# Patient Record
Sex: Female | Born: 1955
Health system: Southern US, Community
[De-identification: ages and names within clinical notes are randomized; demographics above are authoritative.]

## PROBLEM LIST (undated history)

## (undated) DIAGNOSIS — Q359 Cleft palate, unspecified: Secondary | ICD-10-CM

## (undated) DIAGNOSIS — Z789 Other specified health status: Secondary | ICD-10-CM

## (undated) DIAGNOSIS — K8020 Calculus of gallbladder without cholecystitis without obstruction: Secondary | ICD-10-CM

## (undated) DIAGNOSIS — M79609 Pain in unspecified limb: Secondary | ICD-10-CM

## (undated) DIAGNOSIS — B019 Varicella without complication: Secondary | ICD-10-CM

## (undated) DIAGNOSIS — E782 Mixed hyperlipidemia: Secondary | ICD-10-CM

## (undated) DIAGNOSIS — E559 Vitamin D deficiency, unspecified: Secondary | ICD-10-CM

## (undated) DIAGNOSIS — B059 Measles without complication: Secondary | ICD-10-CM

## (undated) DIAGNOSIS — I10 Essential (primary) hypertension: Secondary | ICD-10-CM

## (undated) DIAGNOSIS — H43811 Vitreous degeneration, right eye: Secondary | ICD-10-CM

## (undated) DIAGNOSIS — K219 Gastro-esophageal reflux disease without esophagitis: Secondary | ICD-10-CM

## (undated) DIAGNOSIS — M109 Gout, unspecified: Secondary | ICD-10-CM

## (undated) DIAGNOSIS — N951 Menopausal and female climacteric states: Secondary | ICD-10-CM

## (undated) DIAGNOSIS — E039 Hypothyroidism, unspecified: Secondary | ICD-10-CM

## (undated) DIAGNOSIS — I471 Supraventricular tachycardia, unspecified: Secondary | ICD-10-CM

## (undated) DIAGNOSIS — J45909 Unspecified asthma, uncomplicated: Secondary | ICD-10-CM

## (undated) DIAGNOSIS — H8110 Benign paroxysmal vertigo, unspecified ear: Secondary | ICD-10-CM

## (undated) DIAGNOSIS — M1990 Unspecified osteoarthritis, unspecified site: Secondary | ICD-10-CM

## (undated) DIAGNOSIS — Z8679 Personal history of other diseases of the circulatory system: Secondary | ICD-10-CM

## (undated) DIAGNOSIS — F419 Anxiety disorder, unspecified: Secondary | ICD-10-CM

## (undated) DIAGNOSIS — L851 Acquired keratosis [keratoderma] palmaris et plantaris: Secondary | ICD-10-CM

## (undated) DIAGNOSIS — T7840XA Allergy, unspecified, initial encounter: Secondary | ICD-10-CM

## (undated) HISTORY — DX: Measles without complication: B05.9

## (undated) HISTORY — DX: Supraventricular tachycardia: I47.1

## (undated) HISTORY — DX: Cleft palate, unspecified: Q35.9

## (undated) HISTORY — PX: COSMETIC SURGERY: SHX468

## (undated) HISTORY — DX: Gastro-esophageal reflux disease without esophagitis: K21.9

## (undated) HISTORY — DX: Other specified health status: Z78.9

## (undated) HISTORY — PX: OOPHORECTOMY: SHX86

## (undated) HISTORY — DX: Hypothyroidism, unspecified: E03.9

## (undated) HISTORY — DX: Vitamin D deficiency, unspecified: E55.9

## (undated) HISTORY — PX: OTHER SURGICAL HISTORY: SHX169

## (undated) HISTORY — DX: Allergy, unspecified, initial encounter: T78.40XA

## (undated) HISTORY — DX: Unspecified osteoarthritis, unspecified site: M19.90

## (undated) HISTORY — DX: Acquired keratosis (keratoderma) palmaris et plantaris: L85.1

## (undated) HISTORY — DX: Unspecified asthma, uncomplicated: J45.909

## (undated) HISTORY — PX: TUBAL LIGATION: SHX77

## (undated) HISTORY — DX: Anxiety disorder, unspecified: F41.9

## (undated) HISTORY — DX: Calculus of gallbladder without cholecystitis without obstruction: K80.20

## (undated) HISTORY — DX: Menopausal and female climacteric states: N95.1

## (undated) HISTORY — DX: Supraventricular tachycardia, unspecified: I47.10

## (undated) HISTORY — DX: Varicella without complication: B01.9

## (undated) HISTORY — DX: Pain in unspecified limb: M79.609

## (undated) HISTORY — DX: Benign paroxysmal vertigo, unspecified ear: H81.10

## (undated) HISTORY — DX: Gout, unspecified: M10.9

## (undated) HISTORY — DX: Vitreous degeneration, right eye: H43.811

## (undated) HISTORY — DX: Essential (primary) hypertension: I10

## (undated) HISTORY — DX: Personal history of other diseases of the circulatory system: Z86.79

## (undated) HISTORY — DX: Mixed hyperlipidemia: E78.2

---

## 1994-10-21 HISTORY — PX: OTHER SURGICAL HISTORY: SHX169

## 1996-10-21 HISTORY — PX: GALLBLADDER SURGERY: SHX652

## 1998-03-17 ENCOUNTER — Other Ambulatory Visit: Admission: RE | Admit: 1998-03-17 | Discharge: 1998-03-17 | Payer: Self-pay

## 1998-05-15 ENCOUNTER — Inpatient Hospital Stay (HOSPITAL_COMMUNITY): Admission: AD | Admit: 1998-05-15 | Discharge: 1998-05-17 | Payer: Self-pay | Admitting: *Deleted

## 1998-07-13 ENCOUNTER — Observation Stay (HOSPITAL_COMMUNITY): Admission: RE | Admit: 1998-07-13 | Discharge: 1998-07-14 | Payer: Self-pay | Admitting: Internal Medicine

## 1999-02-12 ENCOUNTER — Inpatient Hospital Stay (HOSPITAL_COMMUNITY): Admission: RE | Admit: 1999-02-12 | Discharge: 1999-02-13 | Payer: Self-pay | Admitting: Obstetrics & Gynecology

## 1999-10-22 HISTORY — PX: ABDOMINAL HYSTERECTOMY: SUR658

## 2001-03-11 ENCOUNTER — Other Ambulatory Visit: Admission: RE | Admit: 2001-03-11 | Discharge: 2001-03-11 | Payer: Self-pay | Admitting: Obstetrics & Gynecology

## 2002-07-27 ENCOUNTER — Other Ambulatory Visit: Admission: RE | Admit: 2002-07-27 | Discharge: 2002-07-27 | Payer: Self-pay | Admitting: Family Medicine

## 2004-10-26 ENCOUNTER — Ambulatory Visit: Payer: Self-pay | Admitting: Internal Medicine

## 2005-05-15 ENCOUNTER — Ambulatory Visit: Payer: Self-pay | Admitting: Internal Medicine

## 2006-07-11 ENCOUNTER — Other Ambulatory Visit: Admission: RE | Admit: 2006-07-11 | Discharge: 2006-07-11 | Payer: Self-pay | Admitting: Family Medicine

## 2007-10-22 HISTORY — PX: FOOT SURGERY: SHX648

## 2008-06-15 ENCOUNTER — Encounter (INDEPENDENT_AMBULATORY_CARE_PROVIDER_SITE_OTHER): Payer: Self-pay | Admitting: Orthopedic Surgery

## 2008-06-15 ENCOUNTER — Ambulatory Visit (HOSPITAL_BASED_OUTPATIENT_CLINIC_OR_DEPARTMENT_OTHER): Admission: RE | Admit: 2008-06-15 | Discharge: 2008-06-15 | Payer: Self-pay | Admitting: Orthopedic Surgery

## 2009-09-21 ENCOUNTER — Encounter: Admission: RE | Admit: 2009-09-21 | Discharge: 2009-09-21 | Payer: Self-pay | Admitting: Family Medicine

## 2010-08-01 ENCOUNTER — Ambulatory Visit: Payer: Self-pay | Admitting: Family Medicine

## 2010-12-05 LAB — HM PAP SMEAR: HM Pap smear: NORMAL

## 2011-01-17 ENCOUNTER — Ambulatory Visit: Payer: Self-pay | Admitting: Family Medicine

## 2011-03-04 ENCOUNTER — Other Ambulatory Visit: Payer: Self-pay | Admitting: Dermatology

## 2011-03-05 NOTE — Op Note (Signed)
Stacy Hart, Stacy Hart               ACCOUNT NO.:  [id number]   MEDICAL RECORD NO.:  [id number]          PATIENT TYPE:  AMB   LOCATION:  DSC                          FACILITY:  [facility]   PHYSICIAN:  Leonides Grills, M.D.     DATE OF BIRTH:  [birth year]   DATE OF PROCEDURE:  DATE OF DISCHARGE:                               OPERATIVE REPORT   PREOPERATIVE DIAGNOSIS:  Left third webspace Morton's neuroma.   POSTOPERATIVE DIAGNOSIS:  Left third webspace Morton's neuroma.   OPERATION:  Excision left third webspace Morton's neuroma.   ANESTHESIA:  General.   SURGEON:  Leonides Grills, MD   ASSISTANT:  Richardean Canal, PA-C   ESTIMATED BLOOD LOSS:  Minimal.   TOURNIQUET TIME:  Approximately 20 minutes.   COMPLICATIONS:  None.   SPECIMEN:  Neuroma sent to pathology.   DISPOSITION:  Stable to PR.   INDICATIONS:  This is a 55 year old female who has had longstanding left  forefoot pain that was interfering with her life with a Mulder click and  tenderness in the plantar aspect of third webspace.  She consented for  the above procedure.  All risks of infection, nerve or vessel injury,  recurrence of neuroma, persistent pain, worsening pain, prolonged  recovery, hematoma formation, sinus formation were all explained.  Questions were encouraged and answered.   OPERATION:  The patient was brought to the operating room, placed in  supine position after adequate general anesthesia was administered as  well as Ancef 1 g IV piggyback.  The left lower extremity was then  prepped and draped in sterile manner over proximally thigh tourniquet.  Limb was gravity exsanguinated.  Tourniquet was elevated to 290 mmHg.  A  longitudinal incision on the dorsal aspect of the left third webspace  was then made.  Dissection was carried down through the skin.  Hemostasis was obtained.  A Morton's neuroma spreader was then placed.  The transverse metatarsal ligament was incised.  The neuroma was  obviously seen and  this was quite large.  This was then delivered  through the wound through the plantar pressure and then the distal  digital branches were cut.  The neuroma was carefully dissected back  about 2 cm proximal to metatarsal head and then cut in the muscular  part of the foot.  This was then sent to pathology.  Tourniquet was  deflated.  Hemostasis was obtained.  There was no pulsatile bleeding.  The area was copiously irrigated with normal saline.  Subcu was closed  with 3-0 Vicryl.  Skin was closed with 4-0 nylon.  Sterile dressing was  applied.  Hard sole shoe was applied.  The patient was stable to PR.      Leonides Grills, M.D.  Electronically Signed     PB/MEDQ  D:  06/15/2008  T:  06/15/2008  Job:  [id number]

## 2012-02-19 IMAGING — CR DG FOOT COMPLETE 3+V*L*
1 series · 3 of 3 positions shown · non-contrast
Comparison: none

REASON FOR EXAM: left foot pain
COMMENTS:

PROCEDURE:     KDR - KDXR FOOT LT COMP W/OBLIQUES  - August 01, 2010 [DATE]
RESULT:     No acute bony or joint abnormality identified. There is no
evidence of fracture.

[Series 2: view not recorded · 0.17mm/px · 3 of 3 slices shown]
[im 1/3]
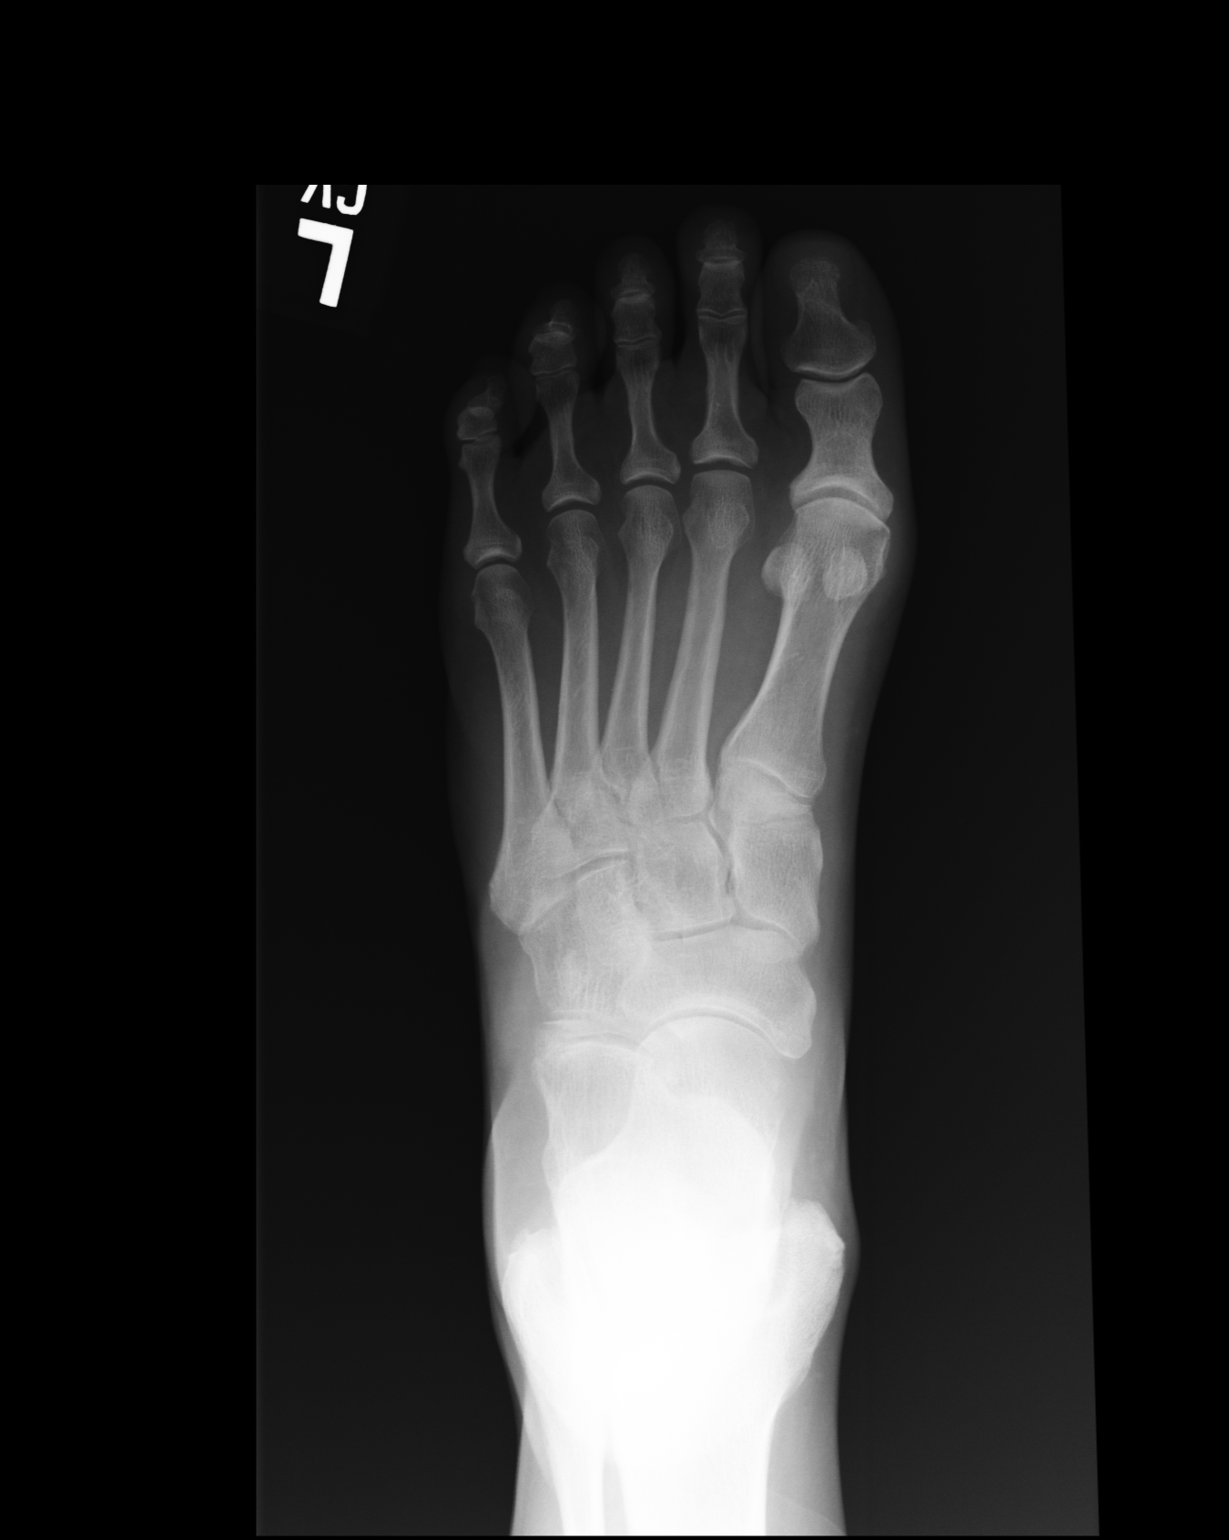
[im 2/3]
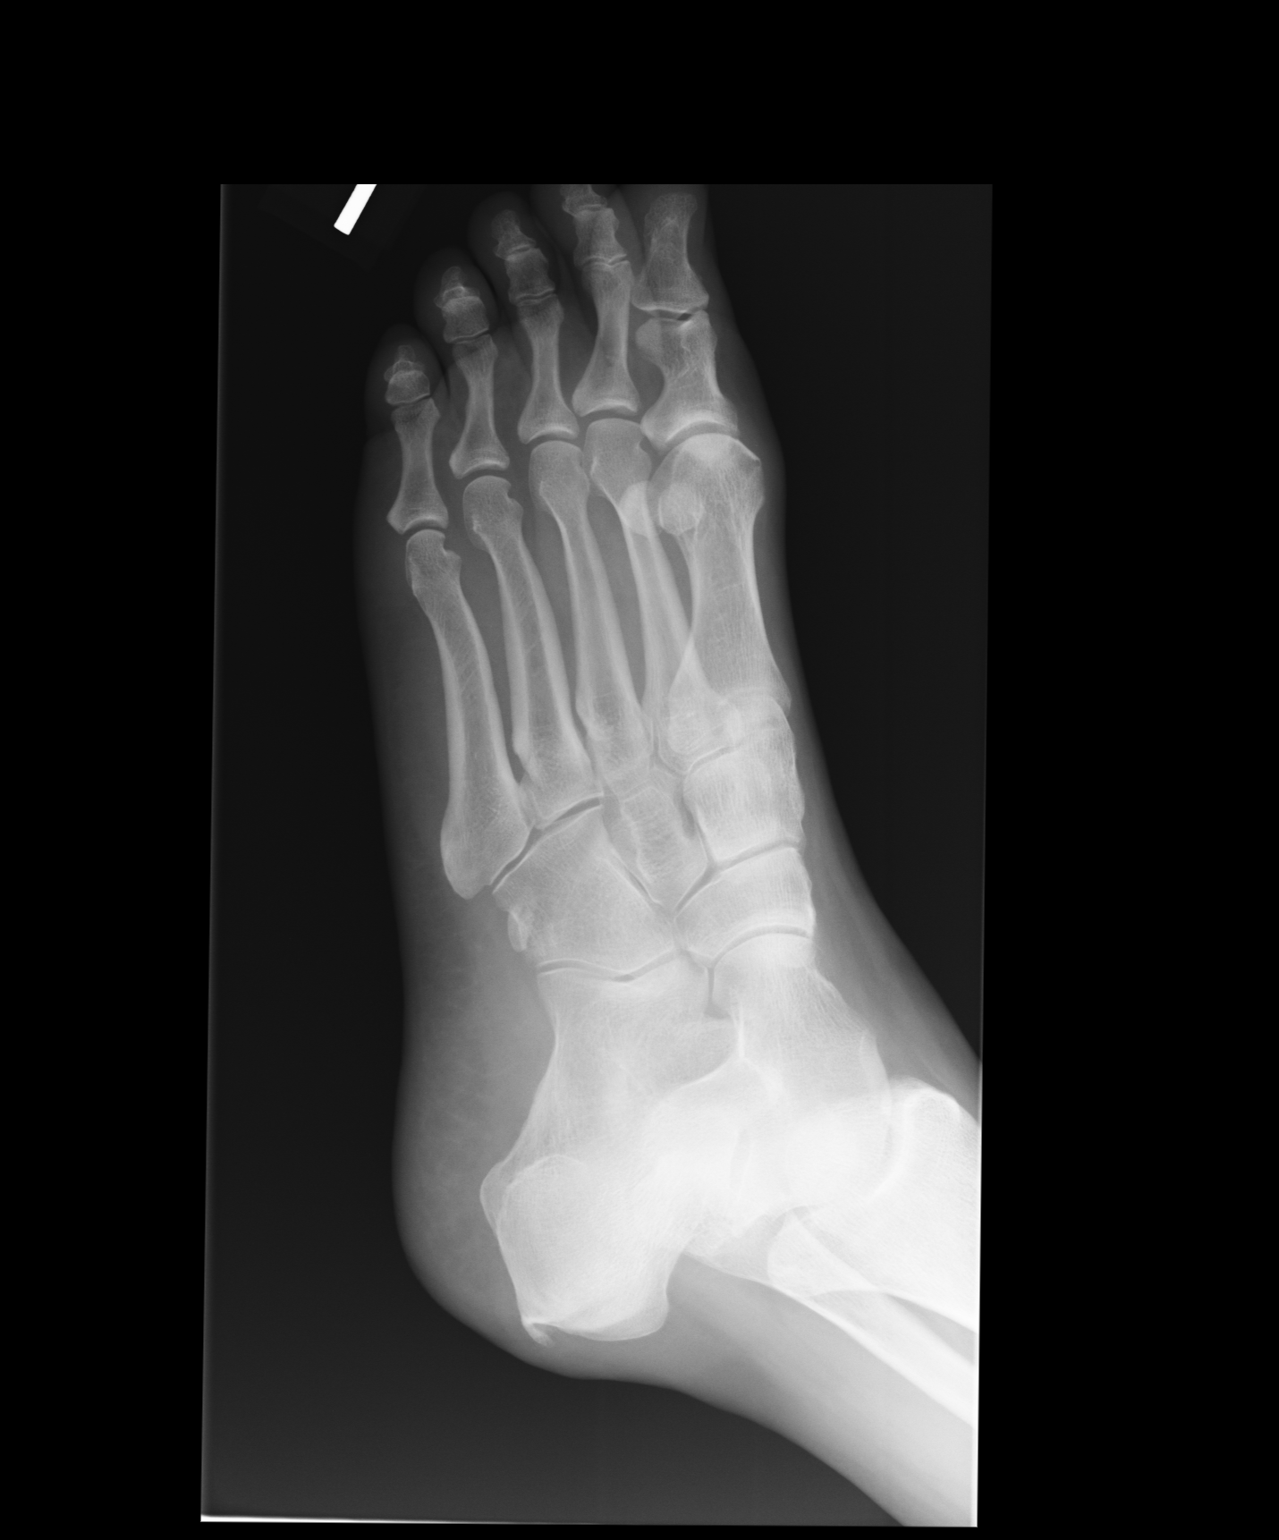
[im 3/3]
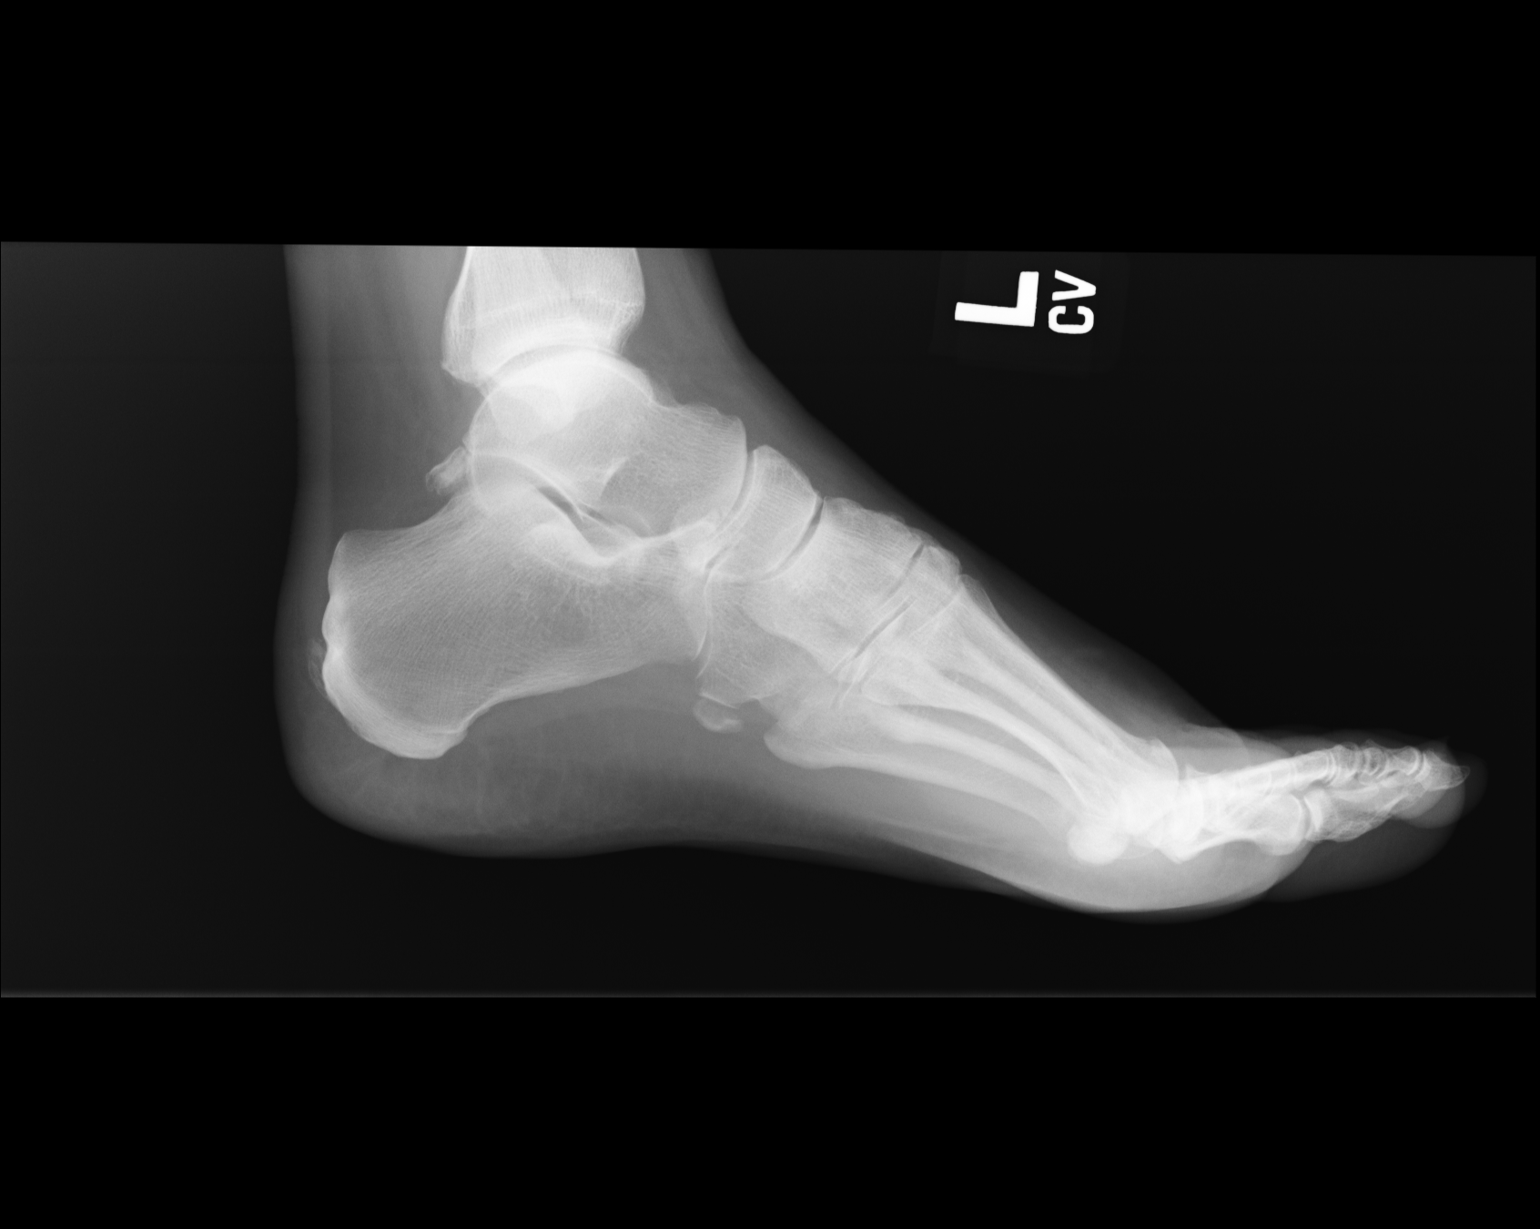

[3 of 3 positions shown; findings below may reference images not displayed]

IMPRESSION: No acute abnormality.

## 2012-06-01 ENCOUNTER — Telehealth: Payer: Self-pay

## 2012-06-01 NOTE — Telephone Encounter (Signed)
Just realized you are here today, can you advise on patient medication renewal? Estradiol 0.5

## 2012-06-01 NOTE — Telephone Encounter (Signed)
I need to speak to Maralyn Sago about this and then will advise, I think she will need to get this from Dr Katrinka Blazing until she is established here.

## 2012-06-01 NOTE — Telephone Encounter (Signed)
PT HAS AN APPT W/ DR Katrinka Blazing IN December BUT NEEDS A REFILL OF HER ESTRADIOL 0.5 MG [facility] [place] #- [phone] CELL# [id number]

## 2012-06-02 NOTE — Telephone Encounter (Signed)
Please have patient contact [facility] for refills until she establishes here in 09/2012.  Thanks, KMS

## 2012-06-03 NOTE — Telephone Encounter (Signed)
I called patient back, she is advised the office in [place] is refilling her patients medications until her patients establish here.

## 2012-08-17 ENCOUNTER — Telehealth: Payer: Self-pay

## 2012-08-17 NOTE — Telephone Encounter (Signed)
This is a former patient from [facility], who states in September, she requested her records to be sent here. She is scheduled to see Dr Katrinka Blazing 10/19/12, but states she needs a refill on Estradiol .05mg . She will be out in 1 week. If rx can be filled, pt [facility] [place] @ [phone]

## 2012-08-18 NOTE — Telephone Encounter (Signed)
Called pt and explained that Stacy Hart should RF her Rxs until she can be seen here in Dec. Pt agreed to call them and will call me back if they are unwilling to RF it until her appt here.  Pt asked that we check to see if Stacy Hart has sent her records and also her daughter's Stacy Hart, DOB 12/25/90. Med Recs can you please check to see if we have received these yet and either let pt know or route back so that we can call her?

## 2012-08-19 NOTE — Telephone Encounter (Signed)
I have scanned in her daughters records put I have not received her records yet.  Dr Katrinka Blazing may have these records because she has a lot of her patients records with her.

## 2012-08-19 NOTE — Telephone Encounter (Signed)
Dr Katrinka Blazing, do you know if you have this pt's records from [facility]?

## 2012-08-28 NOTE — Telephone Encounter (Signed)
Please call pt back; I have received records from [facility].  KMS

## 2012-08-30 NOTE — Telephone Encounter (Signed)
Pt notified that records were recieved

## 2012-09-03 ENCOUNTER — Encounter: Payer: Self-pay | Admitting: *Deleted

## 2012-09-14 ENCOUNTER — Ambulatory Visit (INDEPENDENT_AMBULATORY_CARE_PROVIDER_SITE_OTHER): Payer: BC Managed Care – PPO | Admitting: Family Medicine

## 2012-09-14 ENCOUNTER — Encounter: Payer: Self-pay | Admitting: Family Medicine

## 2012-09-14 VITALS — BP 126/90 | HR 79 | Temp 98.6°F | Resp 16 | Ht 64.0 in | Wt 151.4 lb

## 2012-09-14 DIAGNOSIS — E559 Vitamin D deficiency, unspecified: Secondary | ICD-10-CM

## 2012-09-14 DIAGNOSIS — M109 Gout, unspecified: Secondary | ICD-10-CM

## 2012-09-14 DIAGNOSIS — E039 Hypothyroidism, unspecified: Secondary | ICD-10-CM

## 2012-09-14 DIAGNOSIS — I10 Essential (primary) hypertension: Secondary | ICD-10-CM

## 2012-09-14 DIAGNOSIS — E782 Mixed hyperlipidemia: Secondary | ICD-10-CM

## 2012-09-14 LAB — T4, FREE: Free T4: 1.21 ng/dL (ref 0.80–1.80)

## 2012-09-14 LAB — COMPREHENSIVE METABOLIC PANEL
ALT: 22 U/L (ref 0–35)
AST: 20 U/L (ref 0–37)
Alkaline Phosphatase: 47 U/L (ref 39–117)
Creat: 0.54 mg/dL (ref 0.50–1.10)
Total Bilirubin: 0.6 mg/dL (ref 0.3–1.2)

## 2012-09-14 LAB — CBC WITH DIFFERENTIAL/PLATELET
Basophils Absolute: 0.1 10*3/uL (ref 0.0–0.1)
Basophils Relative: 1 % (ref 0–1)
Eosinophils Absolute: 0.3 10*3/uL (ref 0.0–0.7)
Eosinophils Relative: 3 % (ref 0–5)
Hemoglobin: 15.4 g/dL — ABNORMAL HIGH (ref 12.0–15.0)
MCH: 31 pg (ref 26.0–34.0)
MCV: 89.3 fL (ref 78.0–100.0)
Platelets: 218 10*3/uL (ref 150–400)
RBC: 4.97 MIL/uL (ref 3.87–5.11)
RDW: 13 % (ref 11.5–15.5)
WBC: 10.4 10*3/uL (ref 4.0–10.5)

## 2012-09-14 LAB — LIPID PANEL
Cholesterol: 165 mg/dL (ref 0–200)
Total CHOL/HDL Ratio: 5.2 Ratio

## 2012-09-14 LAB — URIC ACID: Uric Acid, Serum: 7.8 mg/dL — ABNORMAL HIGH (ref 2.4–7.0)

## 2012-09-14 LAB — TSH: TSH: 3.849 u[IU]/mL (ref 0.350–4.500)

## 2012-09-14 MED ORDER — AMOXICILLIN 500 MG PO CAPS: 1000.0000 mg | ORAL_CAPSULE | Freq: Two times a day (BID) | ORAL | Status: DC

## 2012-09-14 MED ORDER — CHOLESTYRAMINE 4 GM/DOSE PO POWD: 4.0000 g | Freq: Two times a day (BID) | ORAL | Status: DC

## 2012-09-14 MED ORDER — LEVOTHYROXINE SODIUM 100 MCG PO TABS: ORAL_TABLET | ORAL | Status: DC

## 2012-09-14 MED ORDER — TRIAMTERENE-HCTZ 37.5-25 MG PO TABS: 1.0000 | ORAL_TABLET | Freq: Every day | ORAL | Status: DC

## 2012-09-14 NOTE — Progress Notes (Signed)
[address]   [place], [place]  [address]   [phone]  Subjective:    Patient ID: Stacy Hart, female    DOB: [birth year], 56 y.o.   MRN: [id number]  HPIThis 56 y.o. female presents for evaluation of the following:  1.  Family history of blood clots: brother with pulmonary embolism, DVT. History of varicose veins.  Started on Xarelto.  Abnormal EKG.  Age 76.  Retired.  CVA retinal; presented with vision blurred and loss.  Seeing a specialist; no regaining of vision at this time.  Also DMII.  2.  HTN:  Six month follow-up; no changes to management at last visit; not checking blood pressure at home. Reports compliance with medication; good tolerance to medication; good symptom control.    3.  Sinus pressure: pain in frontal regions.  +nasal congestion.  L sided congestion.  Tight in eyes.  Using Lubricant eye drops. Ears have been hurting.  Intolerant to antbiotics.  +dizizness.  Scarring in ear.  Duration three months ago.  Took Advil without improvement.  Taking Fluticasone but caused SVT.  Unable to take Allegra.  Has taken children's Benadryl.  Teeth hurt.    4. Hypothyroidism: stable; compliance with medication.  Tolerating recent switch in doses.    5. Hyperlipidemia: stable; cannot tolerate many medications; has lost some weight since last visit intentionally.  6. Gout: rare attacks since last visit; moderate compliance with medication; good tolerance to medication; good symptom control.   7. Vitamin D deficiency: tolerating daily supplementation without side effects; due for repeat labs.     Review of Systems  Constitutional: Negative for fever, chills, diaphoresis and fatigue.  HENT: Positive for congestion, rhinorrhea, mouth sores, voice change, postnasal drip and sinus pressure. Negative for ear pain, sore throat and trouble swallowing.   Respiratory: Negative for shortness of breath, wheezing and stridor.   Cardiovascular: Positive for palpitations. Negative for  chest pain and leg swelling.  Gastrointestinal: Positive for abdominal pain. Negative for nausea, vomiting and diarrhea.  Musculoskeletal: Negative for joint swelling and arthralgias.  Neurological: Positive for headaches. Negative for dizziness, tremors, seizures, syncope, facial asymmetry, speech difficulty, weakness, light-headedness and numbness.    Past Medical History  Diagnosis Date  . Symptomatic menopausal or female climacteric states   . Unspecified vitamin D deficiency   . Unspecified essential hypertension   . Mixed hyperlipidemia   . Benign paroxysmal positional vertigo   . Acquired keratoderma   . Pain in limb   . Esophageal reflux   . Unspecified asthma   . Gout, unspecified   . Unspecified hypothyroidism   . Personal history of other diseases of circulatory system   . Other specified conditions influencing health status   . Cleft palate   . Chicken pox     childhood  . Measles     childhood    Past Surgical History  Procedure Laterality Date  . Surgeries for cleft palatte and lips      [facility]  . Heart ablation  1996    SVT  . Gallbladder surgery  1998  . Abdominal hysterectomy  2001    Ovaries intact, DUB, uterine fibroids  . Foot surgery  2009    Left   Morton's Neuroma    Prior to Admission medications   Medication Sig Start Date End Date Taking? Authorizing Provider  allopurinol (ZYLOPRIM) 100 MG tablet Take 100 mg by mouth 2 (two) times daily.   Yes Historical Provider, MD  ALPRAZolam Prudy Feeler) 0.25  MG tablet Take 0.25 mg by mouth at bedtime as needed.   Yes Historical Provider, MD  cholestyramine Lanetta Inch) 4 GM/DOSE powder Take 1 packet (4 g total) by mouth 2 (two) times daily with a meal. 09/14/12  Yes Ethelda Chick, MD  estradiol (ESTRACE) 0.5 MG tablet Take 0.5 mg by mouth daily.   Yes Historical Provider, MD  levothyroxine (SYNTHROID, LEVOTHROID) 100 MCG tablet One tablet five days per week 09/14/12  Yes Ethelda Chick, MD  levothyroxine  (SYNTHROID, LEVOTHROID) 88 MCG tablet Take 88 mcg by mouth 4 (four) times a week.   Yes Historical Provider, MD  metoprolol (LOPRESSOR) 50 MG tablet Take 50 mg by mouth daily.   Yes Historical Provider, MD  olopatadine (PATANOL) 0.1 % ophthalmic solution 1 drop 2 (two) times daily.   Yes Historical Provider, MD  triamterene-hydrochlorothiazide (MAXZIDE-25) 37.5-25 MG per tablet Take 1 each (1 tablet total) by mouth daily. 09/14/12  Yes Ethelda Chick, MD  amoxicillin (AMOXIL) 500 MG capsule Take 2 capsules (1,000 mg total) by mouth 2 (two) times daily. 09/14/12   Ethelda Chick, MD  fluticasone (FLONASE) 50 MCG/ACT nasal spray Place 2 sprays into the nose daily.    Historical Provider, MD    Allergies  Allergen Reactions  . Epinephrine     "interferes with heart"  . Sulfa Antibiotics   . Ceftin (Cefuroxime Axetil) Rash    History   Social History  . Marital Status: Married    Spouse Name: N/A    Number of Children: 2  . Years of Education: 12   Occupational History  . Full time Parent    Social History Main Topics  . Smoking status: Former Smoker -- 2.00 packs/day for 7 years    Types: Cigarettes  . Smokeless tobacco: Not on file     Comment: quit 28 yrs ago  . Alcohol Use: Yes     Comment: occasonal once a week  . Drug Use: No  . Sexually Active: Not on file   Other Topics Concern  . Not on file   Social History Narrative   Married x 27 years,happily, no domestic abuse. No guns in the home. Smoke alarm in the home. Caffeine use: None. Well balanced diet. Exercise history: Light, walking , 20 min., 2 - 3 times weekly. Organ donor: NO. Pt DOES have Living Will. DOES have HCPOA.    Family History  Problem Relation Age of Onset  . Heart disease Mother   . Stroke Mother   . Depression Mother   . Diabetes Mother   . Cancer Mother   . Heart disease Father   . Diabetes Sister   . Heart disease Sister   . Diabetes Brother   . Pulmonary embolism Brother   . Deep vein  thrombosis Brother   . Hypertension Sister   . Anxiety disorder Sister        Objective:   Physical Exam  Nursing note and vitals reviewed. Constitutional: She is oriented to person, place, and time. She appears well-developed and well-nourished. No distress.  HENT:  Head: Normocephalic and atraumatic.  Right Ear: External ear normal.  Left Ear: External ear normal.  Nose: Mucosal edema and rhinorrhea present. Right sinus exhibits maxillary sinus tenderness and frontal sinus tenderness. Left sinus exhibits maxillary sinus tenderness and frontal sinus tenderness.  Mouth/Throat: Oropharynx is clear and moist.  Eyes: Conjunctivae and EOM are normal. Pupils are equal, round, and reactive to light.  Neck: Normal range of  motion. Neck supple. No JVD present. No thyromegaly present.  Cardiovascular: Normal rate, regular rhythm, normal heart sounds and intact distal pulses.  Exam reveals no gallop and no friction rub.   No murmur heard. Pulmonary/Chest: Effort normal and breath sounds normal. She has no wheezes. She has no rales.  Lymphadenopathy:    She has no cervical adenopathy.  Neurological: She is alert and oriented to person, place, and time. No cranial nerve deficit. She exhibits normal muscle tone. Coordination normal.  Skin: Skin is warm and dry. No rash noted. She is not diaphoretic.  Psychiatric: She has a normal mood and affect. Her behavior is normal.        Assessment & Plan:  Unspecified hypothyroidism - Plan: TSH, T4, free  Mixed hyperlipidemia - Plan: Lipid panel  Unspecified vitamin D deficiency - Plan: Vitamin D 25 hydroxy  Gout - Plan: Uric acid  Essential hypertension, benign - Plan: CBC with Differential, Comprehensive metabolic panel  Sinusitis Acute Maxillary   1.  Hypothyroidism: uncontrolled; tolerating change in dose; repeat labs.  2.  Mixed Hyperlipidemia: uncontrolled; +successful weight loss since last visit; repeat labs. 3. Vitamin D deficiency:  uncontrolled; repeat labs; tolerating supplementation. 4. Gout: stable; rare attacks since last visit; obain labs. 5.  HTN: controlled; no change in medications; obtain labs. 6.  Sinusitis Acute Maxillary:  New.  Rx for Amoxicillin provided.  Meds ordered this encounter  Medications  . cholestyramine (QUESTRAN) 4 GM/DOSE powder    Sig: Take 1 packet (4 g total) by mouth 2 (two) times daily with a meal.    Dispense:  378 g    Refill:  3  . triamterene-hydrochlorothiazide (MAXZIDE-25) 37.5-25 MG per tablet    Sig: Take 1 each (1 tablet total) by mouth daily.    Dispense:  90 tablet    Refill:  3  . amoxicillin (AMOXIL) 500 MG capsule    Sig: Take 2 capsules (1,000 mg total) by mouth 2 (two) times daily.    Dispense:  40 capsule    Refill:  0  . levothyroxine (SYNTHROID, LEVOTHROID) 100 MCG tablet    Sig: One tablet five days per week    Dispense:  30 tablet    Refill:  11

## 2012-09-14 NOTE — Patient Instructions (Addendum)
1. Unspecified hypothyroidism  TSH, T4, free  2. Mixed hyperlipidemia  Lipid panel  3. Unspecified vitamin D deficiency  Vitamin D 25 hydroxy  4. Gout  Uric acid  5. Essential hypertension, benign  CBC with Differential, Comprehensive metabolic panel

## 2012-09-15 LAB — VITAMIN D 25 HYDROXY (VIT D DEFICIENCY, FRACTURES): Vit D, 25-Hydroxy: 30 ng/mL (ref 30–89)

## 2012-09-20 ENCOUNTER — Encounter: Payer: Self-pay | Admitting: *Deleted

## 2012-10-19 ENCOUNTER — Ambulatory Visit: Payer: Self-pay | Admitting: Family Medicine

## 2012-10-21 HISTORY — PX: OOPHORECTOMY: SHX86

## 2012-12-05 NOTE — Progress Notes (Signed)
Reviewed and agree.

## 2013-03-02 ENCOUNTER — Other Ambulatory Visit: Payer: Self-pay | Admitting: Family Medicine

## 2013-03-03 ENCOUNTER — Telehealth: Payer: Self-pay

## 2013-03-03 MED ORDER — ESTRADIOL 0.5 MG PO TABS: 0.5000 mg | ORAL_TABLET | Freq: Every day | ORAL | Status: DC

## 2013-03-03 MED ORDER — ALLOPURINOL 100 MG PO TABS: 100.0000 mg | ORAL_TABLET | Freq: Two times a day (BID) | ORAL | Status: DC

## 2013-03-03 NOTE — Telephone Encounter (Signed)
She also needs the allopurinol/ pended this with the estradiol please advise.

## 2013-03-03 NOTE — Telephone Encounter (Signed)
Pt picked up her recent prescription from her pharmacy and says she needs ov before can be refilled again she states that she has an apt on June 23 but her prescription will run out before then She is wanting to see if she can get another refill to last her to her apt Call  Back number is [phone]  [facility] [place]

## 2013-03-03 NOTE — Telephone Encounter (Signed)
She is asking for additional refill on the Estradiol please advise.

## 2013-03-03 NOTE — Telephone Encounter (Signed)
What medication is she needing? °

## 2013-03-03 NOTE — Telephone Encounter (Signed)
Sent to pharmacy, follow up in June as planned

## 2013-03-27 ENCOUNTER — Other Ambulatory Visit: Payer: Self-pay | Admitting: Family Medicine

## 2013-04-12 ENCOUNTER — Encounter: Payer: Self-pay | Admitting: Family Medicine

## 2013-04-12 ENCOUNTER — Ambulatory Visit (INDEPENDENT_AMBULATORY_CARE_PROVIDER_SITE_OTHER): Payer: BC Managed Care – PPO | Admitting: Family Medicine

## 2013-04-12 VITALS — BP 122/84 | HR 70 | Temp 97.9°F | Resp 16 | Ht 63.5 in | Wt 153.0 lb

## 2013-04-12 DIAGNOSIS — Z0000 Encounter for general adult medical examination without abnormal findings: Secondary | ICD-10-CM

## 2013-04-12 DIAGNOSIS — N898 Other specified noninflammatory disorders of vagina: Secondary | ICD-10-CM

## 2013-04-12 DIAGNOSIS — N949 Unspecified condition associated with female genital organs and menstrual cycle: Secondary | ICD-10-CM

## 2013-04-12 DIAGNOSIS — R252 Cramp and spasm: Secondary | ICD-10-CM

## 2013-04-12 DIAGNOSIS — R102 Pelvic and perineal pain: Secondary | ICD-10-CM

## 2013-04-12 DIAGNOSIS — Z01419 Encounter for gynecological examination (general) (routine) without abnormal findings: Secondary | ICD-10-CM

## 2013-04-12 LAB — CBC WITH DIFFERENTIAL/PLATELET
Basophils Relative: 1 % (ref 0–1)
HCT: 45.5 % (ref 36.0–46.0)
Hemoglobin: 15.7 g/dL — ABNORMAL HIGH (ref 12.0–15.0)
Lymphocytes Relative: 44 % (ref 12–46)
MCHC: 34.5 g/dL (ref 30.0–36.0)
Monocytes Absolute: 0.5 10*3/uL (ref 0.1–1.0)
Monocytes Relative: 5 % (ref 3–12)
Neutro Abs: 4.9 10*3/uL (ref 1.7–7.7)

## 2013-04-12 LAB — FOLATE: Folate: >20 ng/mL

## 2013-04-12 LAB — LIPID PANEL
Cholesterol: 162 mg/dL (ref 0–200)
HDL: 41 mg/dL (ref >39)
Triglycerides: 320 mg/dL — ABNORMAL HIGH (ref <150)

## 2013-04-12 LAB — VITAMIN B12: Vitamin B-12: 721 pg/mL (ref 211–911)

## 2013-04-12 LAB — COMPREHENSIVE METABOLIC PANEL
Albumin: 4.5 g/dL (ref 3.5–5.2)
BUN: 13 mg/dL (ref 6–23)
CO2: 25 mEq/L (ref 19–32)
Calcium: 9.9 mg/dL (ref 8.4–10.5)
Chloride: 101 mEq/L (ref 96–112)
Glucose, Bld: 91 mg/dL (ref 70–99)
Potassium: 4 mEq/L (ref 3.5–5.3)

## 2013-04-12 LAB — POCT URINALYSIS DIPSTICK
Bilirubin, UA: NEGATIVE
Ketones, UA: NEGATIVE
Leukocytes, UA: NEGATIVE
pH, UA: 5

## 2013-04-12 LAB — HEMOGLOBIN A1C: Mean Plasma Glucose: 105 mg/dL (ref <117)

## 2013-04-12 LAB — POCT WET PREP WITH KOH
KOH Prep POC: NEGATIVE
RBC Wet Prep HPF POC: NEGATIVE

## 2013-04-12 LAB — IFOBT (OCCULT BLOOD): IFOBT: NEGATIVE

## 2013-04-12 MED ORDER — LEVOTHYROXINE SODIUM 88 MCG PO TABS: 88.0000 ug | ORAL_TABLET | ORAL | Status: DC

## 2013-04-12 MED ORDER — ALLOPURINOL 100 MG PO TABS: 100.0000 mg | ORAL_TABLET | Freq: Two times a day (BID) | ORAL | Status: DC

## 2013-04-12 MED ORDER — ESTRADIOL 0.5 MG PO TABS: 0.5000 mg | ORAL_TABLET | Freq: Every day | ORAL | Status: DC

## 2013-04-12 MED ORDER — TRIAMTERENE-HCTZ 37.5-25 MG PO TABS: 1.0000 | ORAL_TABLET | Freq: Every day | ORAL | Status: DC

## 2013-04-12 MED ORDER — LEVOTHYROXINE SODIUM 100 MCG PO TABS: ORAL_TABLET | ORAL | Status: DC

## 2013-04-12 MED ORDER — CHOLESTYRAMINE 4 G PO PACK: 1.0000 | PACK | Freq: Two times a day (BID) | ORAL | Status: DC

## 2013-04-12 MED ORDER — METOPROLOL TARTRATE 50 MG PO TABS: 50.0000 mg | ORAL_TABLET | Freq: Every day | ORAL | Status: DC

## 2013-04-12 NOTE — Progress Notes (Signed)
  Subjective:    Patient ID: Stacy Hart, female    DOB: 1956/03/25, 57 y.o.   MRN: [id number]  HPI    Review of Systems  Constitutional: Negative.   HENT: Positive for ear pain, congestion, sneezing, neck pain, sinus pressure and ear discharge.   Eyes: Positive for itching.  Respiratory: Positive for cough and wheezing.   Cardiovascular: Positive for palpitations.  Gastrointestinal: Negative.   Endocrine: Negative.   Genitourinary: Positive for flank pain.  Musculoskeletal: Positive for back pain, joint swelling and arthralgias.  Skin: Negative.   Allergic/Immunologic: Negative.   Neurological: Negative.   Hematological: Negative.   Psychiatric/Behavioral: The patient is nervous/anxious.        Objective:   Physical Exam        Assessment & Plan:

## 2013-04-12 NOTE — Progress Notes (Signed)
[address]   [place], [place]  [address]   [phone]  Subjective:    Patient ID: Stacy Hart, female    DOB: 05-Dec-1955, 57 y.o.   MRN: [id number]  HPI This 57 y.o. female presents for six month follow-up and CPE.  Last physical Pap smear 12/05/10. Mammogram 11/14/13[facility].Did not receive call. Colonsocopy never; refuses. TDAP   Influenza vaccine Eye exam. Dental exam.  Leg cramps: occurs at night with stretching at night.    Plantar Fasciitis: s/p injections in 01/2013 B.  Previously evaluated by [facility] office; office was filty; did not want to return.  Evaluated by Dr. Charlsie Merles with injection.  Assembly line;    Review of Systems  Constitutional: Negative for fever, chills, diaphoresis, activity change, appetite change, fatigue and unexpected weight change.  HENT: Positive for congestion, rhinorrhea, sneezing and postnasal drip. Negative for hearing loss, ear pain, nosebleeds, sore throat, facial swelling, drooling, mouth sores, trouble swallowing, neck pain, neck stiffness, dental problem, voice change, sinus pressure, tinnitus and ear discharge.   Eyes: Negative for photophobia, pain, discharge, redness, itching and visual disturbance.  Respiratory: Positive for cough and wheezing. Negative for choking, chest tightness, shortness of breath and stridor.   Cardiovascular: Positive for palpitations. Negative for chest pain and leg swelling.  Gastrointestinal: Negative for nausea, vomiting, abdominal pain, diarrhea, constipation, blood in stool, abdominal distention, anal bleeding and rectal pain.  Endocrine: Positive for heat intolerance. Negative for cold intolerance, polydipsia, polyphagia and polyuria.  Genitourinary: Positive for flank pain and pelvic pain. Negative for dysuria, frequency, hematuria, decreased urine volume, vaginal bleeding, vaginal discharge, enuresis, difficulty urinating, genital sores and dyspareunia.       +several month history of  lower pelvic; pain; +vaginal odor.    Musculoskeletal: Positive for myalgias, back pain and arthralgias. Negative for joint swelling and gait problem.       +leg cramps.  Skin: Negative for color change, pallor, rash and wound.  Neurological: Negative for dizziness, tremors, seizures, syncope, facial asymmetry, speech difficulty, weakness, light-headedness, numbness and headaches.  Hematological: Negative for adenopathy. Does not bruise/bleed easily.  Psychiatric/Behavioral: Negative for suicidal ideas, hallucinations, behavioral problems, confusion, sleep disturbance, self-injury, dysphoric mood, decreased concentration and agitation. The patient is nervous/anxious. The patient is not hyperactive.        Past Medical History  Diagnosis Date  . Symptomatic menopausal or female climacteric states   . Unspecified vitamin D deficiency   . Unspecified essential hypertension   . Mixed hyperlipidemia   . Benign paroxysmal positional vertigo   . Acquired keratoderma   . Pain in limb   . Esophageal reflux   . Unspecified asthma(493.90)   . Gout, unspecified   . Unspecified hypothyroidism   . Personal history of other diseases of circulatory system   . Other specified conditions influencing health status(V49.89)   . Cleft palate   . Chicken pox     childhood  . Measles     childhood  . Allergy   . Arthritis   . Anxiety    Past Surgical History  Procedure Laterality Date  . Surgeries for cleft palatte and lips      [facility]  . Heart ablation  1996    [facility]  . Gallbladder surgery  1998  . Abdominal hysterectomy  2001    Ovaries intact, DUB, uterine fibroids  . Foot surgery  2009    Left   Morton's Neuroma  . Abdominal hysterectomy    . Tubal ligation    .  Cosmetic surgery     Allergies  Allergen Reactions  . Epinephrine     "interferes with heart"  . Sulfa Antibiotics   . Ceftin (Cefuroxime Axetil) Rash   Current Outpatient Prescriptions on File Prior to Visit  Medication Sig  Dispense Refill  . ALPRAZolam (XANAX) 0.25 MG tablet Take 0.25 mg by mouth at bedtime as needed.      . fluticasone (FLONASE) 50 MCG/ACT nasal spray Place 2 sprays into the nose daily.      Marland Kitchen olopatadine (PATANOL) 0.1 % ophthalmic solution 1 drop 2 (two) times daily.      Marland Kitchen amoxicillin (AMOXIL) 500 MG capsule Take 2 capsules (1,000 mg total) by mouth 2 (two) times daily.  40 capsule  0   No current facility-administered medications on file prior to visit.   History   Social History  . Marital Status: Married    Spouse Name: N/A    Number of Children: 2  . Years of Education: 12   Occupational History  . Full time Parent    Social History Main Topics  . Smoking status: Former Smoker -- 2.00 packs/day for 7 years    Types: Cigarettes  . Smokeless tobacco: Not on file     Comment: quit 28 yrs ago  . Alcohol Use: Yes     Comment: occasonal once a week  . Drug Use: No  . Sexually Active: Yes   Other Topics Concern  . Not on file   Social History Narrative   Married x 27 years,happily, no domestic abuse. No guns in the home. Smoke alarm in the home. Caffeine use: None. Well balanced diet. Exercise history: Light, walking , 20 min., 2 - 3 times weekly. Organ donor: NO. Pt DOES have Living Will. DOES have HCPOA.   Family History  Problem Relation Age of Onset  . Heart disease Mother   . Stroke Mother   . Depression Mother   . Diabetes Mother   . Cancer Mother   . Heart disease Father   . Diabetes Sister   . Heart disease Sister   . Diabetes Brother   . Pulmonary embolism Brother   . Deep vein thrombosis Brother   . Hypertension Sister   . Anxiety disorder Sister     Objective:   Physical Exam  Nursing note and vitals reviewed. Constitutional: She is oriented to person, place, and time. She appears well-developed and well-nourished. No distress.  HENT:  Head: Normocephalic and atraumatic.  Right Ear: External ear normal.  Left Ear: External ear normal.  Nose: Nose  normal.  Mouth/Throat: Oropharynx is clear and moist.  Eyes: Conjunctivae and EOM are normal. Pupils are equal, round, and reactive to light.  Neck: Normal range of motion. Neck supple. No JVD present. No thyromegaly present.  Cardiovascular: Normal rate, regular rhythm, normal heart sounds and intact distal pulses.  Exam reveals no gallop and no friction rub.   No murmur heard. Pulmonary/Chest: Effort normal and breath sounds normal. She has no wheezes. She has no rales. She exhibits no tenderness.  Abdominal: Soft. Bowel sounds are normal. She exhibits no distension and no mass. There is no tenderness. There is no rebound and no guarding.  Genitourinary: Rectum normal and vagina normal. No breast swelling, tenderness, discharge or bleeding. There is no rash, tenderness or lesion on the right labia. There is no rash, tenderness or lesion on the left labia. Right adnexum displays tenderness. Right adnexum displays no mass and no fullness. Left adnexum displays  tenderness. Left adnexum displays no mass and no fullness.  Musculoskeletal: Normal range of motion.  Lymphadenopathy:    She has no cervical adenopathy.  Neurological: She is alert and oriented to person, place, and time. She has normal reflexes. No cranial nerve deficit. She exhibits normal muscle tone. Coordination normal.  Skin: Skin is warm and dry. No rash noted. She is not diaphoretic. No erythema. No pallor.  Psychiatric: She has a normal mood and affect. Her behavior is normal. Judgment and thought content normal.       Assessment & Plan:  Routine general medical examination at a health care facility - Plan: POCT urinalysis dipstick, CBC with Differential, Comprehensive metabolic panel, Lipid panel, TSH, T4, free, Vitamin D 25 hydroxy, Vitamin B12, Folate, EKG 12-Lead, Hemoglobin A1c, IFOBT POC (occult bld, rslt in office), MM Digital Screening, CANCELED: POCT glycosylated hemoglobin (Hb A1C)  Routine gynecological examination -  Plan: Pap IG (Image Guided), POCT Wet Prep with KOH  Pelvic pain - Plan: US Pelvis Complete, POCT Wet Prep with KOH  Leg cramps  Vaginal odor - Plan: POCT Wet Prep with KOH   1. CPE:  Anticipatory guidance.  Pap smear obtained; refer formammogram.  Hemosure obtained.  Immunizations UTD.  Obtain labs.   2.  Gynecological exam: pap smear obtained; refer for mammogram. 3.  Pelvic pain:  New. Refer for pelvic ultrasound to evaluate ovaries; s/p hysterectomy. 4.  Leg cramps: New.  Obtain labs; recommend increased hydration and daily stretching. 5.  Vaginal odor:  New. Obtain Wet prep.  Rx for Metrogel provided for BV.  Meds ordered this encounter  Medications  . triamterene-hydrochlorothiazide (MAXZIDE-25) 37.5-25 MG per tablet    Sig: Take 1 tablet by mouth daily.    Dispense:  90 tablet    Refill:  3  . cholestyramine (QUESTRAN) 4 G packet    Sig: Take 1 packet by mouth 2 (two) times daily with a meal. PATIENT NEEDS OFFICE VISIT FOR ADDITIONAL REFILLS    Dispense:  180 each    Refill:  3  . levothyroxine (SYNTHROID, LEVOTHROID) 100 MCG tablet    Sig: One tablet five days per week    Dispense:  90 tablet    Refill:  3  . levothyroxine (SYNTHROID, LEVOTHROID) 88 MCG tablet    Sig: Take 1 tablet (88 mcg total) by mouth 4 (four) times a week.    Dispense:  90 tablet    Refill:  3  . estradiol (ESTRACE) 0.5 MG tablet    Sig: Take 1 tablet (0.5 mg total) by mouth daily. PATIENT NEEDS OFFICE VISIT FOR ADDITIONAL REFILLS    Dispense:  90 tablet    Refill:  3  . allopurinol (ZYLOPRIM) 100 MG tablet    Sig: Take 1 tablet (100 mg total) by mouth 2 (two) times daily.    Dispense:  180 tablet    Refill:  3  . metoprolol (LOPRESSOR) 50 MG tablet    Sig: Take 1 tablet (50 mg total) by mouth daily.    Dispense:  30 tablet    Refill:  0  . metroNIDAZOLE (METROGEL VAGINAL) 0.75 % vaginal gel    Sig: Place 1 Applicatorful vaginally 2 (two) times daily.    Dispense:  70 g    Refill:  0

## 2013-04-13 ENCOUNTER — Other Ambulatory Visit: Payer: Self-pay | Admitting: Radiology

## 2013-04-13 DIAGNOSIS — R102 Pelvic and perineal pain: Secondary | ICD-10-CM

## 2013-04-14 MED ORDER — METRONIDAZOLE 0.75 % VA GEL: 1.0000 | Freq: Two times a day (BID) | VAGINAL | Status: DC

## 2013-04-19 ENCOUNTER — Ambulatory Visit: Payer: Self-pay | Admitting: Family Medicine

## 2013-04-26 ENCOUNTER — Telehealth: Payer: Self-pay | Admitting: Radiology

## 2013-04-26 DIAGNOSIS — N83201 Unspecified ovarian cyst, right side: Secondary | ICD-10-CM

## 2013-04-26 NOTE — Telephone Encounter (Signed)
Patients US shows multiple cysts on R ovary. Dr Katrinka Blazing wants her to see Gyn, called to see if she has one; she has seen Dr Jennette Kettle years ago, referral sent to his office.

## 2013-04-29 ENCOUNTER — Other Ambulatory Visit (HOSPITAL_COMMUNITY): Payer: Self-pay | Admitting: Obstetrics & Gynecology

## 2013-04-29 DIAGNOSIS — N83209 Unspecified ovarian cyst, unspecified side: Secondary | ICD-10-CM

## 2013-05-03 ENCOUNTER — Ambulatory Visit (HOSPITAL_COMMUNITY)
Admission: RE | Admit: 2013-05-03 | Discharge: 2013-05-03 | Disposition: A | Discharge status: Home / Self Care | Payer: BC Managed Care – PPO | Source: Ambulatory Visit | Attending: Obstetrics & Gynecology | Admitting: Obstetrics & Gynecology

## 2013-05-03 ENCOUNTER — Other Ambulatory Visit (HOSPITAL_COMMUNITY): Payer: Self-pay

## 2013-05-03 ENCOUNTER — Encounter (HOSPITAL_COMMUNITY): Payer: Self-pay

## 2013-05-03 ENCOUNTER — Ambulatory Visit (HOSPITAL_COMMUNITY): Payer: Self-pay

## 2013-05-03 DIAGNOSIS — K7689 Other specified diseases of liver: Secondary | ICD-10-CM | POA: Insufficient documentation

## 2013-05-03 DIAGNOSIS — Z90710 Acquired absence of both cervix and uterus: Secondary | ICD-10-CM | POA: Insufficient documentation

## 2013-05-03 DIAGNOSIS — K429 Umbilical hernia without obstruction or gangrene: Secondary | ICD-10-CM | POA: Insufficient documentation

## 2013-05-03 DIAGNOSIS — N83209 Unspecified ovarian cyst, unspecified side: Secondary | ICD-10-CM | POA: Insufficient documentation

## 2013-05-03 DIAGNOSIS — Q619 Cystic kidney disease, unspecified: Secondary | ICD-10-CM | POA: Insufficient documentation

## 2013-05-03 DIAGNOSIS — Z9089 Acquired absence of other organs: Secondary | ICD-10-CM | POA: Insufficient documentation

## 2013-05-03 DIAGNOSIS — K5730 Diverticulosis of large intestine without perforation or abscess without bleeding: Secondary | ICD-10-CM | POA: Insufficient documentation

## 2013-05-03 MED ORDER — IOHEXOL 300 MG/ML  SOLN
100.0000 mL | Freq: Once | INTRAMUSCULAR | Status: AC | PRN
  Administered 2013-05-03: 100 mL via INTRAVENOUS

## 2013-05-12 ENCOUNTER — Other Ambulatory Visit: Payer: Self-pay | Admitting: Obstetrics & Gynecology

## 2013-05-18 ENCOUNTER — Encounter: Payer: Self-pay | Admitting: Family Medicine

## 2013-10-25 ENCOUNTER — Encounter: Payer: Self-pay | Admitting: Family Medicine

## 2013-10-25 ENCOUNTER — Ambulatory Visit (INDEPENDENT_AMBULATORY_CARE_PROVIDER_SITE_OTHER): Payer: BC Managed Care – PPO | Admitting: Family Medicine

## 2013-10-25 VITALS — BP 135/75 | HR 81 | Temp 97.8°F | Resp 16 | Ht 64.5 in | Wt 154.0 lb

## 2013-10-25 DIAGNOSIS — E782 Mixed hyperlipidemia: Secondary | ICD-10-CM

## 2013-10-25 DIAGNOSIS — N898 Other specified noninflammatory disorders of vagina: Secondary | ICD-10-CM

## 2013-10-25 DIAGNOSIS — E039 Hypothyroidism, unspecified: Secondary | ICD-10-CM

## 2013-10-25 DIAGNOSIS — N949 Unspecified condition associated with female genital organs and menstrual cycle: Secondary | ICD-10-CM

## 2013-10-25 DIAGNOSIS — M109 Gout, unspecified: Secondary | ICD-10-CM

## 2013-10-25 DIAGNOSIS — J0100 Acute maxillary sinusitis, unspecified: Secondary | ICD-10-CM

## 2013-10-25 LAB — LIPID PANEL
CHOL/HDL RATIO: 4.1 ratio
Cholesterol: 160 mg/dL (ref 0–200)
HDL: 39 mg/dL — AB (ref >39)
LDL CALC: 45 mg/dL (ref 0–99)
TRIGLYCERIDES: 380 mg/dL — AB (ref <150)
VLDL: 76 mg/dL — ABNORMAL HIGH (ref 0–40)

## 2013-10-25 LAB — COMPREHENSIVE METABOLIC PANEL
ALBUMIN: 4.2 g/dL (ref 3.5–5.2)
ALT: 20 U/L (ref 0–35)
AST: 20 U/L (ref 0–37)
Alkaline Phosphatase: 43 U/L (ref 39–117)
BILIRUBIN TOTAL: 0.6 mg/dL (ref 0.3–1.2)
BUN: 14 mg/dL (ref 6–23)
CO2: 28 meq/L (ref 19–32)
Calcium: 9.4 mg/dL (ref 8.4–10.5)
Chloride: 99 mEq/L (ref 96–112)
Creat: 0.61 mg/dL (ref 0.50–1.10)
GLUCOSE: 93 mg/dL (ref 70–99)
POTASSIUM: 3.9 meq/L (ref 3.5–5.3)
SODIUM: 139 meq/L (ref 135–145)
TOTAL PROTEIN: 7.4 g/dL (ref 6.0–8.3)

## 2013-10-25 LAB — CBC WITH DIFFERENTIAL/PLATELET
Basophils Absolute: 0.1 10*3/uL (ref 0.0–0.1)
Basophils Relative: 1 % (ref 0–1)
Eosinophils Absolute: 0.3 10*3/uL (ref 0.0–0.7)
Eosinophils Relative: 3 % (ref 0–5)
HCT: 42.9 % (ref 36.0–46.0)
Hemoglobin: 15.1 g/dL — ABNORMAL HIGH (ref 12.0–15.0)
LYMPHS ABS: 4.2 10*3/uL — AB (ref 0.7–4.0)
LYMPHS PCT: 43 % (ref 12–46)
MCH: 30.6 pg (ref 26.0–34.0)
MCHC: 35.2 g/dL (ref 30.0–36.0)
MCV: 87 fL (ref 78.0–100.0)
Monocytes Absolute: 0.6 10*3/uL (ref 0.1–1.0)
Monocytes Relative: 6 % (ref 3–12)
NEUTROS ABS: 4.5 10*3/uL (ref 1.7–7.7)
NEUTROS PCT: 47 % (ref 43–77)
PLATELETS: 208 10*3/uL (ref 150–400)
RBC: 4.93 MIL/uL (ref 3.87–5.11)
RDW: 13.3 % (ref 11.5–15.5)
WBC: 9.7 10*3/uL (ref 4.0–10.5)

## 2013-10-25 LAB — URIC ACID: Uric Acid, Serum: 8.7 mg/dL — ABNORMAL HIGH (ref 2.4–7.0)

## 2013-10-25 LAB — TSH: TSH: 5.328 u[IU]/mL — ABNORMAL HIGH (ref 0.350–4.500)

## 2013-10-25 LAB — T4, FREE: Free T4: 1.39 ng/dL (ref 0.80–1.80)

## 2013-10-25 MED ORDER — METOPROLOL TARTRATE 50 MG PO TABS: 50.0000 mg | ORAL_TABLET | Freq: Every day | ORAL | Status: DC

## 2013-10-25 MED ORDER — LEVOTHYROXINE SODIUM 88 MCG PO TABS: 88.0000 ug | ORAL_TABLET | ORAL | Status: DC

## 2013-10-25 MED ORDER — FLUTICASONE PROPIONATE 50 MCG/ACT NA SUSP: 2.0000 | Freq: Every day | NASAL | Status: DC

## 2013-10-25 MED ORDER — METRONIDAZOLE 0.75 % VA GEL: 1.0000 | Freq: Two times a day (BID) | VAGINAL | Status: DC

## 2013-10-25 MED ORDER — ALPRAZOLAM 0.25 MG PO TABS: 0.2500 mg | ORAL_TABLET | Freq: Every evening | ORAL | Status: DC | PRN

## 2013-10-25 NOTE — Progress Notes (Signed)
Subjective:    Patient ID: Stacy Hart, female    DOB: 1956-03-16, 58 y.o.   MRN: [id number]  HPI This 58 y.o. female presents for six month follow-up:  1. Hyperlipidemia:  Presenting for six month follow-up; has not been taking Questran to one daily.  Stopped due to constipation.  2.  Gout:  No recent issues; tries to remember to take Allopurinol.   3. Ovarian cysts: s/p B ovarian resection with fallopian tubes.  No ovarian cancer.  Dr. Jennette Kettle recommended surgery STAT.  Recovered well.    4.  Hypothyroidism:Patient reports good compliance with medication, good tolerance to medication, and good symptom control.    5.  R ear pain:  Ringing in ears; pain in R ear.  Has Amoxicillin.  +nasal congestion lately.  Balance has been off a bit.  Not using Flonase.    6.  HA: wakes up with a headache a lot of mornings.  Usually takes Aleve or Advil.   Onset eight months ago.  Large amount of congestion. Once per week on average has headaches.  7. Vaginal odor: having a vaginal odor for a few weeks.     Review of Systems  Constitutional: Negative for fever, chills, diaphoresis and fatigue.  HENT: Positive for congestion, ear pain, postnasal drip, rhinorrhea and sinus pressure.   Eyes: Negative for visual disturbance.  Respiratory: Negative for cough and shortness of breath.   Cardiovascular: Negative for chest pain, palpitations and leg swelling.  Gastrointestinal: Negative for nausea, vomiting, abdominal pain, diarrhea and constipation.  Endocrine: Negative for cold intolerance, heat intolerance, polydipsia, polyphagia and polyuria.  Genitourinary: Negative for pelvic pain.  Musculoskeletal: Positive for arthralgias.  Neurological: Positive for headaches. Negative for dizziness, tremors, seizures, syncope, facial asymmetry, speech difficulty, weakness, light-headedness and numbness.   Past Medical History  Diagnosis Date  . Symptomatic menopausal or female climacteric states   .  Unspecified vitamin D deficiency   . Unspecified essential hypertension   . Mixed hyperlipidemia   . Benign paroxysmal positional vertigo   . Acquired keratoderma   . Pain in limb   . Esophageal reflux   . Unspecified asthma(493.90)   . Gout, unspecified   . Unspecified hypothyroidism   . Personal history of other diseases of circulatory system   . Other specified conditions influencing health status(V49.89)   . Cleft palate   . Chicken pox     childhood  . Measles     childhood  . Allergy   . Arthritis   . Anxiety   . Vitreous detachment of right eye 03/21/2014    [facility]   Allergies  Allergen Reactions  . Epinephrine     "interferes with heart"  . Sulfa Antibiotics   . Ceftin [Cefuroxime Axetil] Rash       Objective:   Physical Exam  Constitutional: She is oriented to person, place, and time. She appears well-developed and well-nourished. No distress.  HENT:  Head: Normocephalic and atraumatic.  Right Ear: External ear normal.  Left Ear: External ear normal.  Nose: Right sinus exhibits maxillary sinus tenderness and frontal sinus tenderness. Left sinus exhibits maxillary sinus tenderness and frontal sinus tenderness.  Mouth/Throat: Oropharynx is clear and moist.  Eyes: Conjunctivae and EOM are normal. Pupils are equal, round, and reactive to light.  Neck: Normal range of motion. Neck supple. Carotid bruit is not present. No thyromegaly present.  Cardiovascular: Normal rate, regular rhythm, normal heart sounds and intact distal pulses.  Exam reveals no gallop and  no friction rub.   No murmur heard. Pulmonary/Chest: Effort normal and breath sounds normal. She has no wheezes. She has no rales.  Abdominal: Soft. Bowel sounds are normal. She exhibits no distension and no mass. There is no tenderness. There is no rebound and no guarding.  Genitourinary: There is no rash, tenderness or lesion on the right labia. There is no rash, tenderness or lesion on the left labia. Vaginal  discharge found.  Lymphadenopathy:    She has no cervical adenopathy.  Neurological: She is alert and oriented to person, place, and time. No cranial nerve deficit.  Skin: Skin is warm and dry. No rash noted. She is not diaphoretic. No erythema. No pallor.  Psychiatric: She has a normal mood and affect. Her behavior is normal.          Assessment & Plan:  Mixed hyperlipidemia - Plan: Lipid panel, Comprehensive metabolic panel, CBC with Differential  Unspecified hypothyroidism - Plan: TSH, T4, free, Comprehensive metabolic panel, CBC with Differential  Gout - Plan: Uric acid  Vaginal odor  Acute maxillary sinusitis   1. Mixed hyperlipidemia:  Stable; obtain labs; recommend dietary modification. 2.  Hypothyroidism: stable; obtain labs; continue current medications. 3.  Gout: stable; obtain labs; encourage compliance with Allopurinol. 4.  SVT: stable; continue PRN Metoprolol; refill provided. 5.  Vaginal odor/BV:  New.  Rx for Metrogel provided. 6.  Acute maxillary sinusitis: New. Continue Amoxicillin; rx for Flonase provided.   Meds ordered this encounter  Medications  . fluticasone (FLONASE) 50 MCG/ACT nasal spray    Sig: Place 2 sprays into both nostrils daily.    Dispense:  16 g    Refill:  6  . ALPRAZolam (XANAX) 0.25 MG tablet    Sig: Take 1 tablet (0.25 mg total) by mouth at bedtime as needed.    Dispense:  30 tablet    Refill:  0  . metoprolol (LOPRESSOR) 50 MG tablet    Sig: Take 1 tablet (50 mg total) by mouth daily.    Dispense:  30 tablet    Refill:  0  . DISCONTD: levothyroxine (SYNTHROID, LEVOTHROID) 88 MCG tablet    Sig: Take 1 tablet (88 mcg total) by mouth 2 (two) times a week.    Dispense:  90 tablet    Refill:  3  . metroNIDAZOLE (METROGEL VAGINAL) 0.75 % vaginal gel    Sig: Place 1 Applicatorful vaginally 2 (two) times daily.    Dispense:  70 g    Refill:  0   Nilda SimmerKristi Kohle Winner, M.D.  [facility]  [place] [address] [place], [place]  [id number] [phone] phone [phone] fax

## 2014-03-21 DIAGNOSIS — H43811 Vitreous degeneration, right eye: Secondary | ICD-10-CM

## 2014-03-21 HISTORY — DX: Vitreous degeneration, right eye: H43.811

## 2014-04-14 ENCOUNTER — Other Ambulatory Visit: Payer: Self-pay | Admitting: Family Medicine

## 2014-05-02 ENCOUNTER — Encounter: Payer: BC Managed Care – PPO | Admitting: Family Medicine

## 2014-06-01 ENCOUNTER — Encounter: Payer: BC Managed Care – PPO | Admitting: Family Medicine

## 2014-06-05 ENCOUNTER — Other Ambulatory Visit: Payer: Self-pay | Admitting: Family Medicine

## 2014-07-04 ENCOUNTER — Other Ambulatory Visit: Payer: Self-pay | Admitting: Obstetrics & Gynecology

## 2014-07-04 ENCOUNTER — Other Ambulatory Visit: Payer: Self-pay | Admitting: Family Medicine

## 2014-07-04 LAB — HM MAMMOGRAPHY

## 2014-07-05 LAB — CYTOLOGY - PAP

## 2014-07-20 ENCOUNTER — Encounter: Payer: Self-pay | Admitting: Family Medicine

## 2014-07-20 ENCOUNTER — Ambulatory Visit (INDEPENDENT_AMBULATORY_CARE_PROVIDER_SITE_OTHER): Payer: BC Managed Care – PPO | Admitting: Family Medicine

## 2014-07-20 VITALS — BP 134/90 | HR 73 | Temp 97.9°F | Resp 16 | Ht 64.25 in | Wt 150.8 lb

## 2014-07-20 DIAGNOSIS — Z8379 Family history of other diseases of the digestive system: Secondary | ICD-10-CM

## 2014-07-20 DIAGNOSIS — I498 Other specified cardiac arrhythmias: Secondary | ICD-10-CM

## 2014-07-20 DIAGNOSIS — J3089 Other allergic rhinitis: Secondary | ICD-10-CM

## 2014-07-20 DIAGNOSIS — M1000 Idiopathic gout, unspecified site: Secondary | ICD-10-CM

## 2014-07-20 DIAGNOSIS — Z131 Encounter for screening for diabetes mellitus: Secondary | ICD-10-CM

## 2014-07-20 DIAGNOSIS — Z1211 Encounter for screening for malignant neoplasm of colon: Secondary | ICD-10-CM

## 2014-07-20 DIAGNOSIS — Z0000 Encounter for general adult medical examination without abnormal findings: Secondary | ICD-10-CM

## 2014-07-20 DIAGNOSIS — M109 Gout, unspecified: Secondary | ICD-10-CM

## 2014-07-20 DIAGNOSIS — E782 Mixed hyperlipidemia: Secondary | ICD-10-CM

## 2014-07-20 DIAGNOSIS — E039 Hypothyroidism, unspecified: Secondary | ICD-10-CM

## 2014-07-20 DIAGNOSIS — I471 Supraventricular tachycardia: Secondary | ICD-10-CM

## 2014-07-20 DIAGNOSIS — M2550 Pain in unspecified joint: Secondary | ICD-10-CM

## 2014-07-20 DIAGNOSIS — J302 Other seasonal allergic rhinitis: Secondary | ICD-10-CM

## 2014-07-20 LAB — TSH: TSH: 10.772 u[IU]/mL — ABNORMAL HIGH (ref 0.350–4.500)

## 2014-07-20 LAB — POCT URINALYSIS DIPSTICK
Bilirubin, UA: NEGATIVE
Glucose, UA: NEGATIVE
KETONES UA: NEGATIVE
Leukocytes, UA: NEGATIVE
Nitrite, UA: NEGATIVE
PH UA: 5
PROTEIN UA: NEGATIVE
SPEC GRAV UA: 1.01
Urobilinogen, UA: 0.2

## 2014-07-20 LAB — CBC WITH DIFFERENTIAL/PLATELET
BASOS PCT: 1 % (ref 0–1)
Basophils Absolute: 0.1 10*3/uL (ref 0.0–0.1)
Eosinophils Absolute: 0.3 10*3/uL (ref 0.0–0.7)
Eosinophils Relative: 3 % (ref 0–5)
HCT: 44.9 % (ref 36.0–46.0)
HEMOGLOBIN: 15.5 g/dL — AB (ref 12.0–15.0)
LYMPHS ABS: 5.4 10*3/uL — AB (ref 0.7–4.0)
Lymphocytes Relative: 47 % — ABNORMAL HIGH (ref 12–46)
MCH: 31.1 pg (ref 26.0–34.0)
MCHC: 34.5 g/dL (ref 30.0–36.0)
MCV: 90.2 fL (ref 78.0–100.0)
MONOS PCT: 5 % (ref 3–12)
Monocytes Absolute: 0.6 10*3/uL (ref 0.1–1.0)
NEUTROS ABS: 5 10*3/uL (ref 1.7–7.7)
NEUTROS PCT: 44 % (ref 43–77)
Platelets: 230 10*3/uL (ref 150–400)
RBC: 4.98 MIL/uL (ref 3.87–5.11)
RDW: 13 % (ref 11.5–15.5)
WBC: 11.4 10*3/uL — ABNORMAL HIGH (ref 4.0–10.5)

## 2014-07-20 LAB — COMPLETE METABOLIC PANEL WITH GFR
ALBUMIN: 4.4 g/dL (ref 3.5–5.2)
ALK PHOS: 47 U/L (ref 39–117)
ALT: 17 U/L (ref 0–35)
AST: 18 U/L (ref 0–37)
BUN: 14 mg/dL (ref 6–23)
CO2: 28 mEq/L (ref 19–32)
Calcium: 9.8 mg/dL (ref 8.4–10.5)
Chloride: 98 mEq/L (ref 96–112)
Creat: 0.66 mg/dL (ref 0.50–1.10)
GFR, Est African American: >89 mL/min
GLUCOSE: 81 mg/dL (ref 70–99)
POTASSIUM: 3.7 meq/L (ref 3.5–5.3)
SODIUM: 138 meq/L (ref 135–145)
TOTAL PROTEIN: 7.6 g/dL (ref 6.0–8.3)
Total Bilirubin: 0.5 mg/dL (ref 0.2–1.2)

## 2014-07-20 LAB — URIC ACID: Uric Acid, Serum: 7.3 mg/dL — ABNORMAL HIGH (ref 2.4–7.0)

## 2014-07-20 LAB — HEMOGLOBIN A1C
Hgb A1c MFr Bld: 5.2 % (ref <5.7)
Mean Plasma Glucose: 103 mg/dL (ref <117)

## 2014-07-20 LAB — T4, FREE: Free T4: 1.15 ng/dL (ref 0.80–1.80)

## 2014-07-20 LAB — LIPID PANEL
CHOL/HDL RATIO: 5.4 ratio
Cholesterol: 179 mg/dL (ref 0–200)
HDL: 33 mg/dL — AB (ref >39)
TRIGLYCERIDES: 823 mg/dL — AB (ref <150)

## 2014-07-20 LAB — RHEUMATOID FACTOR

## 2014-07-20 LAB — CK: Total CK: 42 U/L (ref 7–177)

## 2014-07-20 MED ORDER — LEVOTHYROXINE SODIUM 100 MCG PO TABS: ORAL_TABLET | ORAL | Status: DC

## 2014-07-20 MED ORDER — TRIAMTERENE-HCTZ 37.5-25 MG PO TABS: 1.0000 | ORAL_TABLET | Freq: Every day | ORAL | Status: DC

## 2014-07-20 MED ORDER — ALLOPURINOL 100 MG PO TABS
100.0000 mg | ORAL_TABLET | Freq: Two times a day (BID) | ORAL | Status: DC
Start: 2014-07-20 — End: 2015-10-02

## 2014-07-20 MED ORDER — LEVOTHYROXINE SODIUM 88 MCG PO TABS: 88.0000 ug | ORAL_TABLET | ORAL | Status: DC

## 2014-07-20 MED ORDER — CHOLESTYRAMINE 4 G PO PACK: 4.0000 g | PACK | Freq: Two times a day (BID) | ORAL | Status: DC

## 2014-07-20 NOTE — Patient Instructions (Signed)

## 2014-07-20 NOTE — Progress Notes (Addendum)
Subjective:    Patient ID: Stacy Hart, female    DOB: 1956/02/07, 58 y.o.   MRN: [id number]  This chart was scribed for Wardell Honour, MD by Rosary Lively, ED scribe. This patient was seen in room Room/bed 21 and the patient's care was started at 10:15 AM.  HPI HPI Comments:  Stacy Hart is a 58 y.o. female who presents to [facility] for a physical exam.   Last Physical: 04/13/13 Pap Smear:  03/2013, 09/18/ 2015. Colonoscopy: Refused Bone Density: 07/08/2014; Normal Mammogram: 07/08/2014; Normal Tetanus: Refused Flu Vaccine: Refused Eye Exam: Dr. Baird Cancer; right eye detached retina.   Family History Mother: Deceased 59 y.o. 2 Sisters: Detached Retina, Diabetic 1 Brother: Diabetic, Obesity, CVA which resulted in loss of eye sight, DVT  Pt is married, and lives with husband and daughter. Pt reports that she has recently moved into a new house.  Exercise and Diet: Pt walks regularly.  Pt concerns for this visit: 1. Pt reports that she does have allergies, and takes her allergy medication every other day, but she feels like it affects her heart beat. Pt inquires about taking an aspirin every day due to the occasional discomfort that she experiences. Pt reports that she also experienced SOB on yesterday, and that this occurs occasionally. Pt believes that this is related to her allergies. 2. Pt reports that she snores occassionally, which she also believes is related to allergies as well.  3. Pt reports that in the morning she has a difficult time with her joints in her hands and feet, and experiences swelling, along with stiffness. Pt reports that she experiences discomfort in her hip and down her right leg at time. Pt reports that she has a h/o plantar fascitis.  Pt reports that her mother had deterioration in her back, and that she has two cousins with arthritis. Pt reports that she also has a cousin with Lupus.  4. Pt expresses that she experiences discomfort with her  bowels, and takes imodium. Pt reports that she does experience gas and Pt would like to be checked for celiac's disease; her daughter was just diagnosed with Celiac's disease and gastroenterologist recommended family members be tested. Pt denies blood in her stool.  5. Pt reports that she used to urinate 2-5 times per night, however since her estradiol was increased she seemed to urinate only one time per night.  6. Emotional: Pt denies that she has experienced any sadness or anxiety.     Review of Systems  Constitutional: Negative for fever, chills, diaphoresis, activity change, appetite change, fatigue and unexpected weight change.  HENT: Positive for congestion and ear pain (Right, intermittent, with ringing in the ears.). Negative for dental problem, drooling, ear discharge, facial swelling, hearing loss, mouth sores, nosebleeds, postnasal drip, rhinorrhea, sinus pressure, sneezing, sore throat, tinnitus, trouble swallowing and voice change.   Eyes: Negative for photophobia, pain, discharge, redness, itching and visual disturbance.  Respiratory: Positive for shortness of breath. Negative for apnea, cough, choking, chest tightness, wheezing and stridor.   Cardiovascular: Positive for palpitations. Negative for chest pain and leg swelling.  Gastrointestinal: Positive for diarrhea. Negative for nausea, vomiting, abdominal pain, constipation, blood in stool, abdominal distention, anal bleeding and rectal pain.  Endocrine: Negative for cold intolerance, heat intolerance, polydipsia, polyphagia and polyuria.  Genitourinary: Negative for dysuria, urgency, frequency, hematuria, flank pain, decreased urine volume, vaginal bleeding, vaginal discharge, enuresis, difficulty urinating, genital sores, vaginal pain, menstrual problem, pelvic pain and dyspareunia.  Musculoskeletal: Positive  for arthralgias and neck stiffness. Negative for back pain, gait problem, joint swelling, myalgias and neck pain.  Skin:  Negative for color change, pallor, rash and wound.  Allergic/Immunologic: Positive for environmental allergies. Negative for food allergies and immunocompromised state.  Neurological: Negative for dizziness, tremors, seizures, syncope, facial asymmetry, speech difficulty, weakness, light-headedness, numbness and headaches.  Hematological: Negative for adenopathy. Does not bruise/bleed easily.  Psychiatric/Behavioral: Negative for suicidal ideas, hallucinations, behavioral problems, confusion, sleep disturbance, self-injury, dysphoric mood, decreased concentration and agitation. The patient is not nervous/anxious and is not hyperactive.        Objective:   Physical Exam  Nursing note and vitals reviewed. Constitutional: She is oriented to person, place, and time. She appears well-developed and well-nourished. No distress.  HENT:  Head: Normocephalic and atraumatic.  Right Ear: External ear normal.  Left Ear: External ear normal.  Nose: Nose normal.  Mouth/Throat: Oropharynx is clear and moist.  Eyes: Conjunctivae and EOM are normal. Pupils are equal, round, and reactive to light.  Neck: Normal range of motion. Neck supple. Carotid bruit is not present. No thyromegaly present.  Cardiovascular: Normal rate, regular rhythm, normal heart sounds and intact distal pulses.  Exam reveals no gallop and no friction rub.   No murmur heard. Pulmonary/Chest: Effort normal and breath sounds normal. No respiratory distress. She has no wheezes. She has no rales.  Abdominal: Soft. Bowel sounds are normal. She exhibits no distension and no mass. There is no tenderness. There is no rebound and no guarding.  Musculoskeletal: Normal range of motion.       Right shoulder: Normal.       Left shoulder: Normal.       Cervical back: Normal.  Lymphadenopathy:    She has no cervical adenopathy.  Neurological: She is alert and oriented to person, place, and time. She has normal reflexes. No cranial nerve deficit.  She exhibits normal muscle tone. Coordination normal.  Skin: Skin is warm and dry. No rash noted. She is not diaphoretic.  Psychiatric: She has a normal mood and affect. Her behavior is normal. Judgment and thought content normal.    EKG: :NSR.     Assessment & Plan:  Acute idiopathic gout, unspecified site - Plan: CBC with Differential, COMPLETE METABOLIC PANEL WITH GFR, Uric acid  Routine general medical examination at a health care facility - Plan: POCT urinalysis dipstick  Unspecified hypothyroidism - Plan: CBC with Differential, TSH, T4, free  Mixed hyperlipidemia - Plan: COMPLETE METABOLIC PANEL WITH GFR, Lipid panel  Arthralgia - Plan: CK, ANA, Rheumatoid factor  Family history of celiac disease - Plan: Celiac Panel  Colon cancer screening - Plan: IFOBT POC (occult bld, rslt in office)  Screening for diabetes mellitus - Plan: COMPLETE METABOLIC PANEL WITH GFR, Hemoglobin A1c, POCT urinalysis dipstick  SVT (supraventricular tachycardia) - Plan: EKG 12-Lead  Other seasonal allergic rhinitis  1. Complete Physical Examination: anticipatory guidance -- exercise, continued weight loss. Pap smear, bone density, mammogram UTD.  Pt refuses colonoscopy; provided with hemosure kit to complete at home.  Pt refused TDAP and flu vaccines. 2.  Gout: stable; obtain labs; refill provided. 3.  Hypothyroidism: controlled; obtain labs; refill provided. 4.  Hyperlipidemia: stable; obtain labs. 5.  Arthralgias:  New.  Having morning stiffness in multiple B joints; obtain labs to rule out autoimmune process. If persists or worsens, refer to rheumatology. 6.  Family history of celiac sprue: New. Daughter recently diagnosed with Celiac; obtain labs; pt declined referral to GI.  Has  suffered with chronic intermittent diarrhea. 7.  Colon cancer screening: pt refuses colonoscopy; agreeable to completing home hemosure kit. 8.  Screening DMII: obtain glucose and HgbA1c. 9.  SVT: stable; obtain EKG;  continue PRN Metoprolol. 10. HTN: controlled; refill of Maxzide provided. 11.  Allergic Rhinitis: uncontrolled; recommend daily Flonase.   I personally performed the services described in this documentation, which was scribed in my presence.  The recorded information has been reviewed and is accurate.  Reginia Forts, M.D.  [facility] [address] [place], [place]  [address] [phone] phone [phone] fax

## 2014-07-20 NOTE — Progress Notes (Signed)
   Subjective:    Patient ID: Stacy Hart, female    DOB: 04-Aug-1956, 58 y.o.   MRN: [id number]  HPI    Review of Systems  Constitutional: Positive for diaphoresis.  HENT: Positive for ear pain and sinus pressure.   Eyes: Negative.   Respiratory: Positive for shortness of breath.   Cardiovascular: Positive for palpitations.  Gastrointestinal: Negative.   Endocrine: Positive for heat intolerance.  Genitourinary: Negative.   Musculoskeletal: Positive for arthralgias, joint swelling and myalgias.  Skin: Negative.   Allergic/Immunologic: Negative.   Neurological: Negative.   Hematological: Negative.   Psychiatric/Behavioral: Negative.        Objective:   Physical Exam        Assessment & Plan:

## 2014-07-21 DIAGNOSIS — E039 Hypothyroidism, unspecified: Secondary | ICD-10-CM | POA: Insufficient documentation

## 2014-07-21 DIAGNOSIS — I471 Supraventricular tachycardia: Secondary | ICD-10-CM | POA: Insufficient documentation

## 2014-07-21 DIAGNOSIS — E782 Mixed hyperlipidemia: Secondary | ICD-10-CM | POA: Insufficient documentation

## 2014-07-21 DIAGNOSIS — Z8379 Family history of other diseases of the digestive system: Secondary | ICD-10-CM | POA: Insufficient documentation

## 2014-07-21 DIAGNOSIS — M1000 Idiopathic gout, unspecified site: Secondary | ICD-10-CM | POA: Insufficient documentation

## 2014-07-21 LAB — ANA: ANA: NEGATIVE

## 2014-07-26 MED ORDER — FENOFIBRATE 145 MG PO TABS: 145.0000 mg | ORAL_TABLET | Freq: Every day | ORAL | Status: DC

## 2014-07-26 NOTE — Addendum Note (Signed)
Addended by: Ethelda ChickSMITH, Adedamola Seto M on: 07/26/2014 03:14 PM   Modules accepted: Orders

## 2014-07-29 ENCOUNTER — Other Ambulatory Visit: Payer: Self-pay

## 2014-07-29 MED ORDER — LEVOTHYROXINE SODIUM 88 MCG PO TABS: 88.0000 ug | ORAL_TABLET | ORAL | Status: DC

## 2014-08-01 LAB — GLIA (IGA/G) + TTG IGA
GLIADIN IGA: 15 U/mL (ref <20)
Gliadin IgG: 4.8 U/mL (ref <20)
TISSUE TRANSGLUTAMINASE AB, IGA: 13.3 U/mL (ref <20)

## 2014-11-07 IMAGING — US US PELV - US TRANSVAGINAL
1 series · 13 of 25 positions shown · non-contrast
Comparison: none

REASON FOR EXAM: pelvic pain
COMMENTS:

PROCEDURE:     US  - US PELVIS EXAM W/TRANSVAGINAL  - April 19, 2013  [DATE]
RESULT:     Comparison: None.
TECHNIQUE: Multiple grayscale and color Doppler images were obtained of the
pelvis via transabdominal and endovaginal ultrasound.

[Series 1: us pelv - us transvaginal · 0.28mm/px · 13 of 89 slices shown]
[im 1/89]
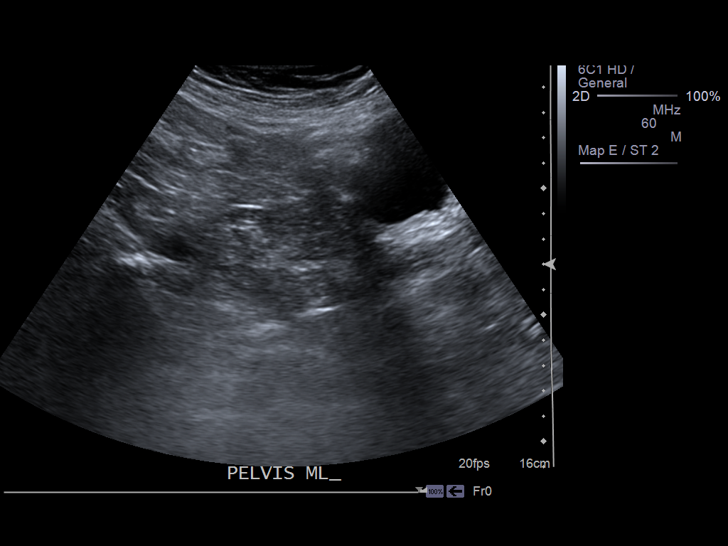
[im 8/89]
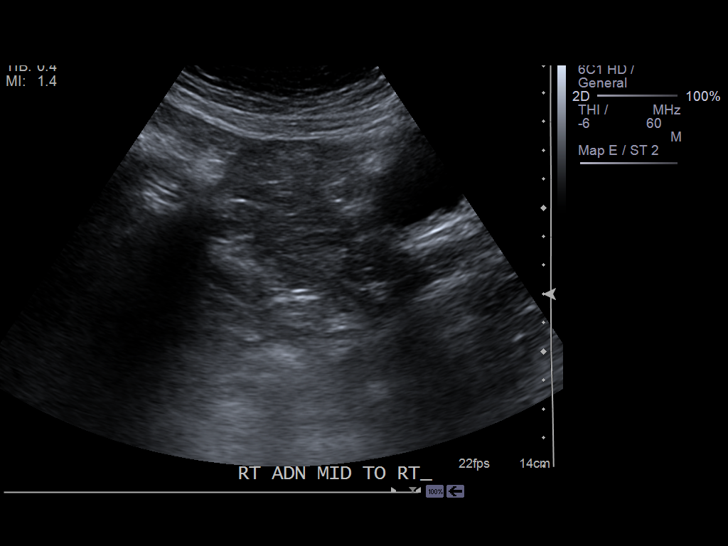
[im 15/89]
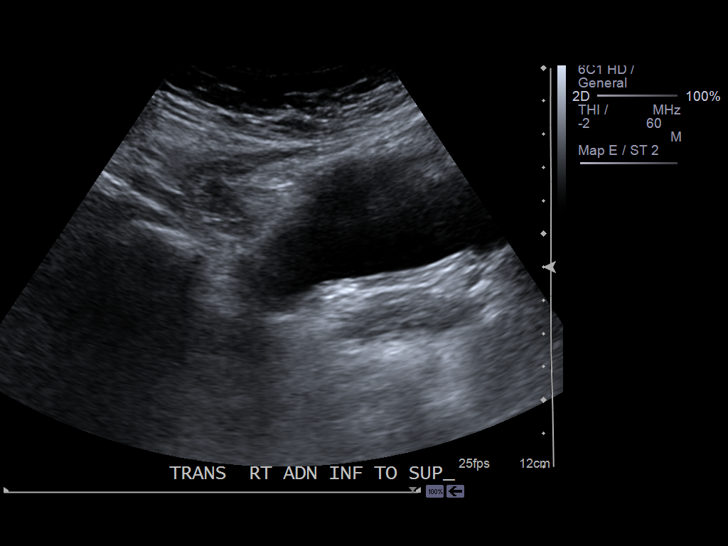
[im 23/89]
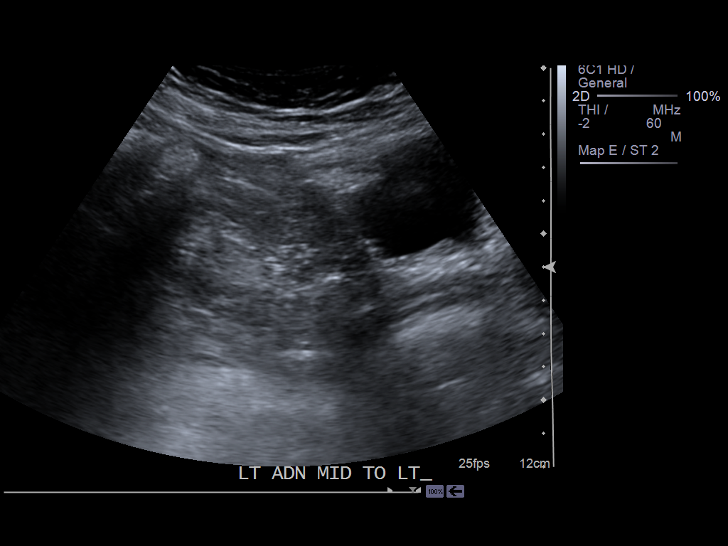
[im 30/89]
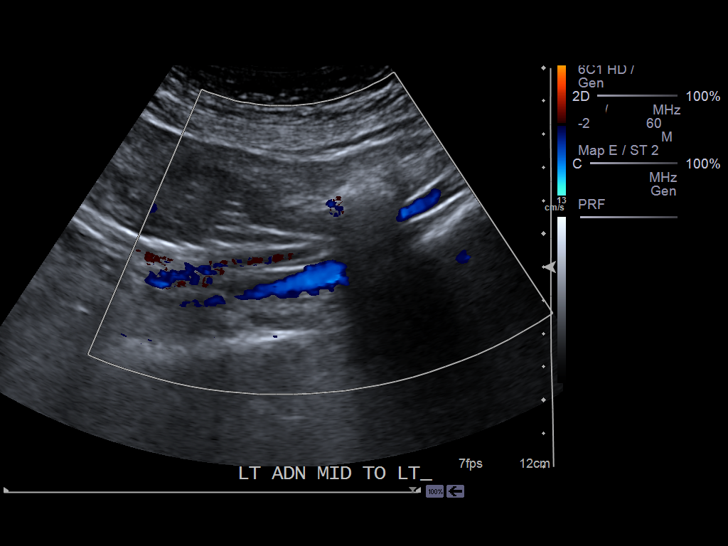
[im 37/89]
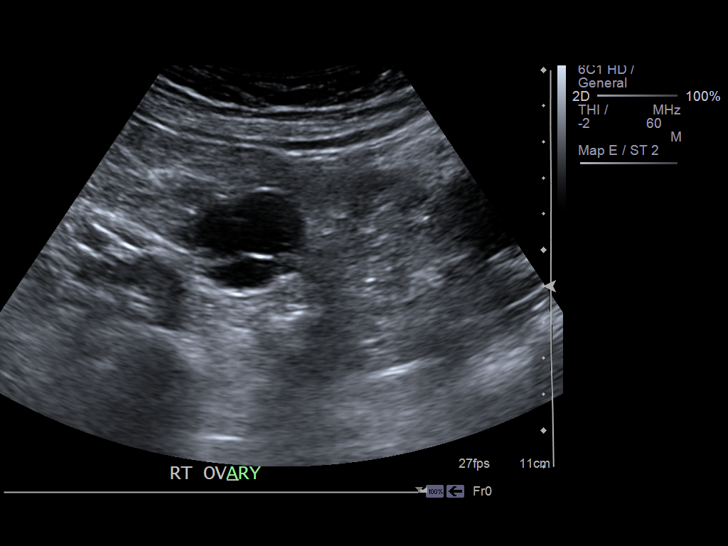
[im 45/89]
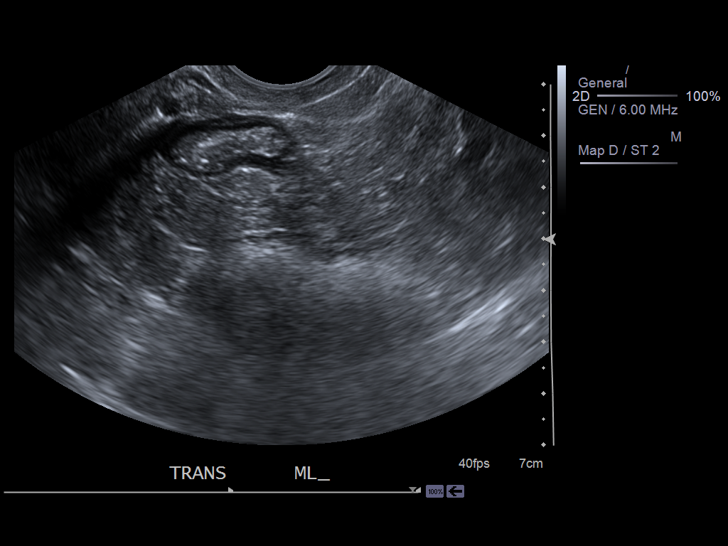
[im 52/89]
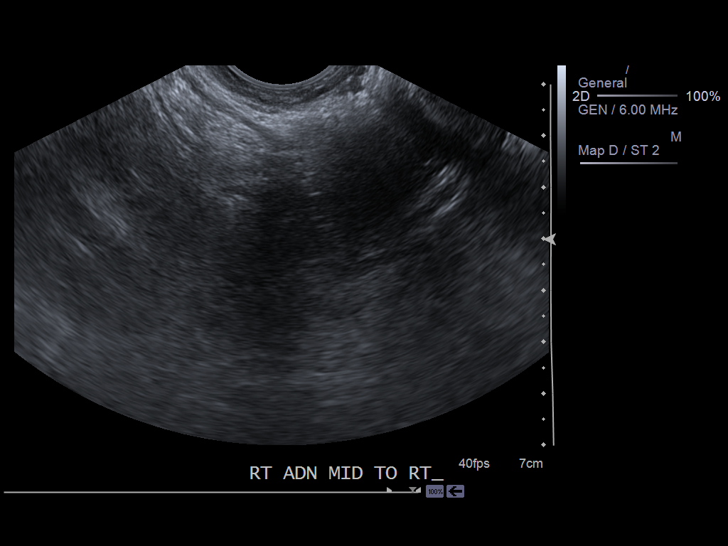
[im 59/89]
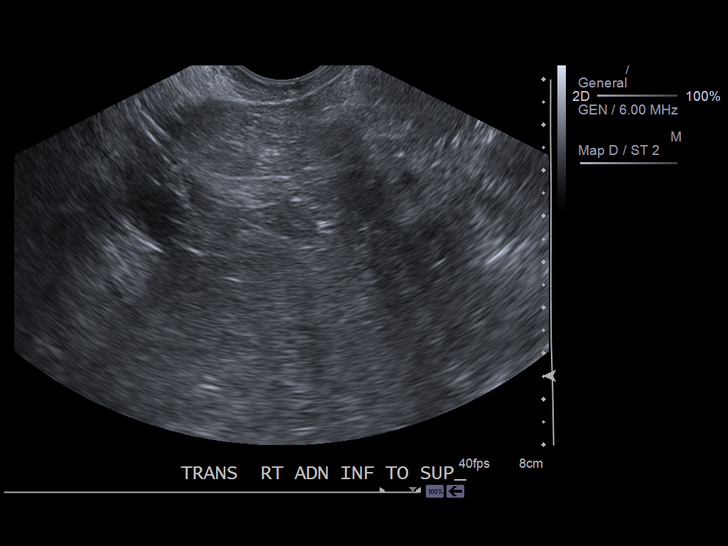
[im 67/89]
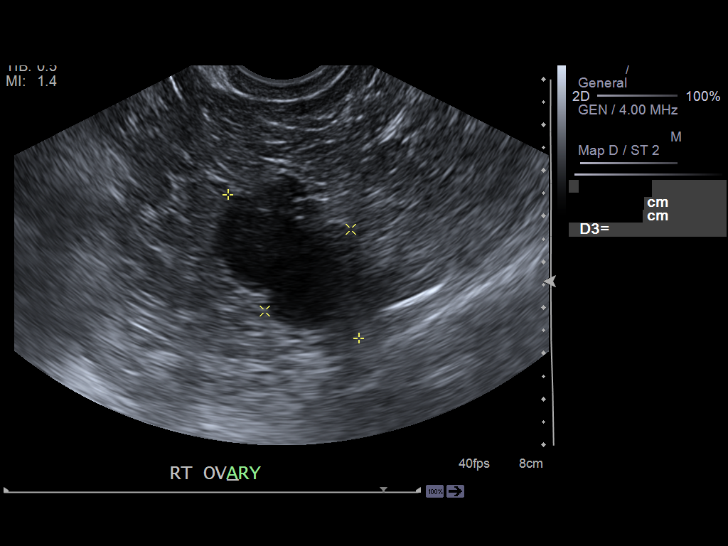
[im 74/89]
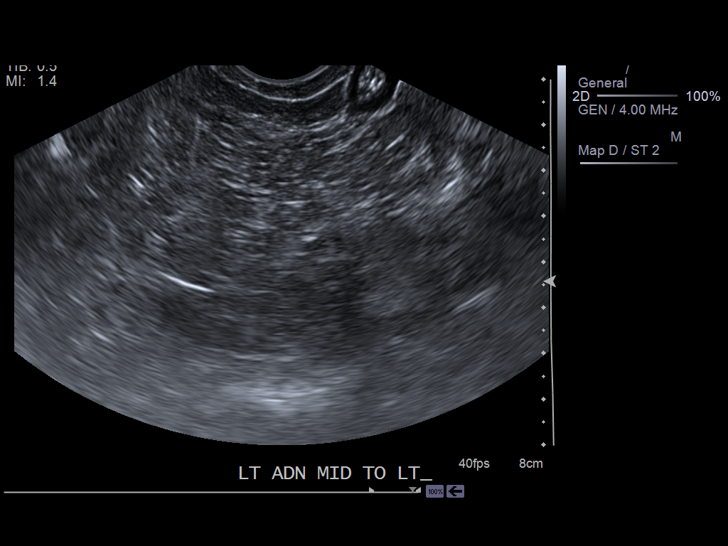
[im 81/89]
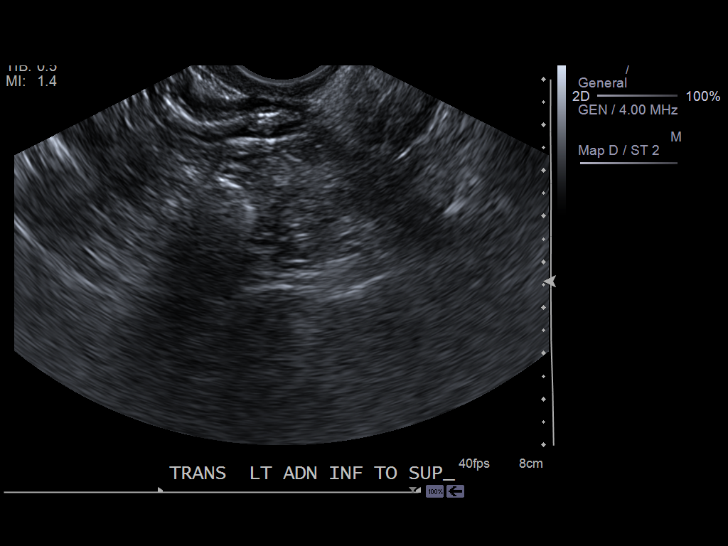
[im 89/89]
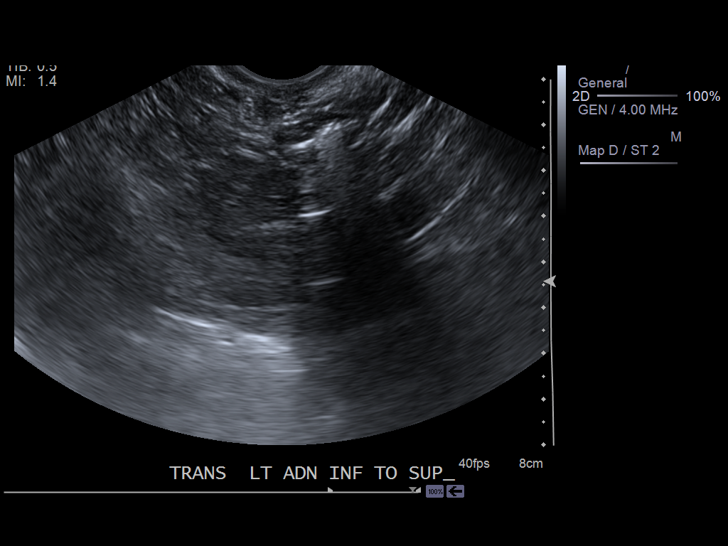

[13 of 25 positions shown; findings below may reference images not displayed]

FINDINGS: The uterus is not visualized, consistent with prior hysterectomy. There is a
structure in the right adnexa which is felt to represent the right ovary. It
measures 4.3 x 2.6 x 2.4 cm. There are multiple cystic structures associated
with what is likely the right ovary. The largest measures approximately
x 1.5 x 1.4 cm. Color Doppler flow is associated with the right ovary.
Spectral Doppler imaging was not performed. The left ovary was not
visualized.
IMPRESSION: 1. There are multiple cystic structures in what is likely the right ovary.
The largest measures 2.9 cm in greatest dimension. If the patient is
premenopausal, followup pelvic ultrasound would be recommended in 6 weeks to
ensure resolution. If the patient is postmenopausal, differential would
include benign and malignant cystic ovarian neoplasms, and gynecology
consultation would be recommended.
2. Prior hysterectomy. The left ovary was not visualized.

[REDACTED]

## 2015-01-18 ENCOUNTER — Ambulatory Visit: Payer: BC Managed Care – PPO | Admitting: Family Medicine

## 2015-02-20 ENCOUNTER — Ambulatory Visit: Payer: Self-pay | Admitting: Family Medicine

## 2015-04-03 ENCOUNTER — Encounter: Payer: Self-pay | Admitting: *Deleted

## 2015-04-05 ENCOUNTER — Telehealth: Payer: Self-pay | Admitting: *Deleted

## 2015-04-05 NOTE — Telephone Encounter (Signed)
Received 07/08/2014 requested mammo results from [facility] - no mammographic evidence of malignancy.  Updated health maintenance, care team (to add ob/gyn) and abstracted.

## 2015-07-03 ENCOUNTER — Ambulatory Visit: Payer: Self-pay | Admitting: Family Medicine

## 2015-07-17 ENCOUNTER — Ambulatory Visit: Payer: Self-pay | Admitting: Family Medicine

## 2015-07-25 ENCOUNTER — Ambulatory Visit (INDEPENDENT_AMBULATORY_CARE_PROVIDER_SITE_OTHER): Payer: BLUE CROSS/BLUE SHIELD | Admitting: Family Medicine

## 2015-07-25 ENCOUNTER — Telehealth: Payer: Self-pay

## 2015-07-25 VITALS — BP 140/86 | HR 77 | Temp 97.8°F | Resp 16 | Ht 64.0 in | Wt 160.0 lb

## 2015-07-25 DIAGNOSIS — R300 Dysuria: Secondary | ICD-10-CM

## 2015-07-25 DIAGNOSIS — R319 Hematuria, unspecified: Secondary | ICD-10-CM

## 2015-07-25 LAB — POC MICROSCOPIC URINALYSIS (UMFC): Mucus: ABSENT

## 2015-07-25 LAB — POCT URINALYSIS DIP (MANUAL ENTRY)
BILIRUBIN UA: NEGATIVE
Glucose, UA: NEGATIVE
NITRITE UA: NEGATIVE
PH UA: 5.5
Protein Ur, POC: >=300 — AB
Spec Grav, UA: 1.02
Urobilinogen, UA: 1

## 2015-07-25 MED ORDER — NITROFURANTOIN MACROCRYSTAL 100 MG PO CAPS: 100.0000 mg | ORAL_CAPSULE | Freq: Four times a day (QID) | ORAL | Status: DC

## 2015-07-25 NOTE — Progress Notes (Signed)
° °  Subjective:    Patient ID: Mariapaula Krist, female    DOB: Jun 28, 1956, 59 y.o.   MRN: [id number] This chart was scribed for Elvina Sidle, MD by Littie Deeds, Medical Scribe. This patient was seen in Room 12 and the patient's care was started at 12:11 PM.   HPI HPI Comments: Deztinee Lohmeyer is a 59 y.o. female who presents to the [facility] complaining of intermittent dysuria, described as a discomfort, that started 2 weeks ago. She has only had about 2-3 episodes. She has been having difficulty urinating and suprapubic abdominal discomfort since this morning. Patient has also noticed hematuria this morning, which she has not noticed in prior UTIs. She denies back pain, fever, and vomiting. Her last UTI was a few years ago. Her prior UTIs were usually treated with Macrobid which has been effective. She has allergies to epinephrine, sulfa and Ceftin.    Review of Systems  Constitutional: Negative for fever.  Gastrointestinal: Positive for abdominal pain. Negative for vomiting.  Genitourinary: Positive for dysuria, hematuria and difficulty urinating.  Musculoskeletal: Negative for back pain.       Objective:   Physical Exam CONSTITUTIONAL: Well developed/well nourished HEAD: Normocephalic/atraumatic EYES: EOM/PERRL ENMT: Mucous membranes moist NECK: supple no meningeal signs SPINE: entire spine nontender CV: S1/S2 noted, no murmurs/rubs/gallops noted LUNGS: Lungs are clear to auscultation bilaterally, no apparent distress ABDOMEN: soft, nontender, no rebound or guarding GU: no cva tenderness NEURO: Pt is awake/alert, moves all extremitiesx4 EXTREMITIES: pulses normal, full ROM SKIN: warm, color normal PSYCH: no abnormalities of mood noted     Assessment & Plan:   By signing my name below, I, Littie Deeds, attest that this documentation has been prepared under the direction and in the presence of Elvina Sidle, MD.  Electronically Signed: Littie Deeds, Medical Scribe. 07/25/2015. 12:07 PM. This chart was scribed in my presence and reviewed by me personally.    ICD-9-CM ICD-10-CM   1. Dysuria 788.1 R30.0 POCT Microscopic Urinalysis ([facility])     POCT urinalysis dipstick     nitrofurantoin (MACRODANTIN) 100 MG capsule     Urine culture  2. Hematuria 599.70 R31.9 POCT Microscopic Urinalysis ([facility])     POCT urinalysis dipstick     nitrofurantoin (MACRODANTIN) 100 MG capsule     Urine culture     Signed, Elvina Sidle, MD

## 2015-07-25 NOTE — Patient Instructions (Signed)

## 2015-07-25 NOTE — Telephone Encounter (Signed)
PATIENT STATES SHE SAW DR.LAUENSTEIN Tuesday FOR A UTI. HE PRESCRIBED HER NITROFURANTOIN 100 MG AND TOLD HER THAT SHE WOULD TAKE IT 2 TIMES A DAY FOR 7 DAYS AND GAVE HER #14. WHEN SHE PICKED UP THE MEDICINE IT SAYS FOR HER TO TAKE IT 4 TIMES A DAY. SHE WANTS TO KNOW WHICH WAY IS CORRECT? BEST PHONE [phone] (CELL)  MBC

## 2015-07-26 ENCOUNTER — Other Ambulatory Visit: Payer: Self-pay | Admitting: Family Medicine

## 2015-07-26 NOTE — Telephone Encounter (Signed)
The patient is to take Macrobid twice a day. Patient verbalized understanding.

## 2015-07-27 LAB — URINE CULTURE: Colony Count: >=100000

## 2015-07-29 ENCOUNTER — Other Ambulatory Visit: Payer: Self-pay | Admitting: Family Medicine

## 2015-07-31 ENCOUNTER — Ambulatory Visit (INDEPENDENT_AMBULATORY_CARE_PROVIDER_SITE_OTHER): Payer: BLUE CROSS/BLUE SHIELD | Admitting: Family Medicine

## 2015-07-31 ENCOUNTER — Encounter: Payer: Self-pay | Admitting: Family Medicine

## 2015-07-31 VITALS — BP 140/86 | HR 81 | Temp 98.2°F | Resp 16 | Ht 64.0 in | Wt 161.4 lb

## 2015-07-31 DIAGNOSIS — E782 Mixed hyperlipidemia: Secondary | ICD-10-CM

## 2015-07-31 DIAGNOSIS — I471 Supraventricular tachycardia, unspecified: Secondary | ICD-10-CM

## 2015-07-31 DIAGNOSIS — N390 Urinary tract infection, site not specified: Secondary | ICD-10-CM | POA: Diagnosis not present

## 2015-07-31 DIAGNOSIS — E038 Other specified hypothyroidism: Secondary | ICD-10-CM | POA: Diagnosis not present

## 2015-07-31 DIAGNOSIS — M1000 Idiopathic gout, unspecified site: Secondary | ICD-10-CM

## 2015-07-31 DIAGNOSIS — R7302 Impaired glucose tolerance (oral): Secondary | ICD-10-CM | POA: Diagnosis not present

## 2015-07-31 DIAGNOSIS — Z1211 Encounter for screening for malignant neoplasm of colon: Secondary | ICD-10-CM

## 2015-07-31 DIAGNOSIS — Z8379 Family history of other diseases of the digestive system: Secondary | ICD-10-CM | POA: Diagnosis not present

## 2015-07-31 DIAGNOSIS — E034 Atrophy of thyroid (acquired): Secondary | ICD-10-CM

## 2015-07-31 DIAGNOSIS — Z800 Family history of malignant neoplasm of digestive organs: Secondary | ICD-10-CM | POA: Diagnosis not present

## 2015-07-31 DIAGNOSIS — R002 Palpitations: Secondary | ICD-10-CM | POA: Diagnosis not present

## 2015-07-31 LAB — COMPREHENSIVE METABOLIC PANEL
ALK PHOS: 42 U/L (ref 33–130)
ALT: 20 U/L (ref 6–29)
AST: 24 U/L (ref 10–35)
Albumin: 4.3 g/dL (ref 3.6–5.1)
BILIRUBIN TOTAL: 0.5 mg/dL (ref 0.2–1.2)
BUN: 13 mg/dL (ref 7–25)
CO2: 28 mmol/L (ref 20–31)
CREATININE: 0.59 mg/dL (ref 0.50–0.99)
Calcium: 9.6 mg/dL (ref 8.6–10.4)
Chloride: 97 mmol/L — ABNORMAL LOW (ref 98–110)
GLUCOSE: 108 mg/dL — AB (ref 65–99)
Potassium: 3.5 mmol/L (ref 3.5–5.3)
Sodium: 137 mmol/L (ref 135–146)
Total Protein: 7.2 g/dL (ref 6.1–8.1)

## 2015-07-31 LAB — HEMOGLOBIN A1C
Hgb A1c MFr Bld: 5.5 % (ref <5.7)
Mean Plasma Glucose: 111 mg/dL (ref <117)

## 2015-07-31 LAB — POC MICROSCOPIC URINALYSIS (UMFC): MUCUS RE: ABSENT

## 2015-07-31 LAB — CBC WITH DIFFERENTIAL/PLATELET
BASOS ABS: 0.1 10*3/uL (ref 0.0–0.1)
Basophils Relative: 1 % (ref 0–1)
EOS ABS: 0.5 10*3/uL (ref 0.0–0.7)
Eosinophils Relative: 4 % (ref 0–5)
HEMATOCRIT: 42.9 % (ref 36.0–46.0)
Hemoglobin: 15.3 g/dL — ABNORMAL HIGH (ref 12.0–15.0)
LYMPHS PCT: 38 % (ref 12–46)
Lymphs Abs: 4.6 10*3/uL — ABNORMAL HIGH (ref 0.7–4.0)
MCH: 31.7 pg (ref 26.0–34.0)
MCHC: 35.7 g/dL (ref 30.0–36.0)
MCV: 88.8 fL (ref 78.0–100.0)
MPV: 11.5 fL (ref 8.6–12.4)
Monocytes Absolute: 0.8 10*3/uL (ref 0.1–1.0)
Monocytes Relative: 7 % (ref 3–12)
Neutro Abs: 6 10*3/uL (ref 1.7–7.7)
Neutrophils Relative %: 50 % (ref 43–77)
PLATELETS: 249 10*3/uL (ref 150–400)
RBC: 4.83 MIL/uL (ref 3.87–5.11)
RDW: 13.2 % (ref 11.5–15.5)
WBC: 12 10*3/uL — AB (ref 4.0–10.5)

## 2015-07-31 LAB — POCT URINALYSIS DIP (MANUAL ENTRY)
BILIRUBIN UA: NEGATIVE
Bilirubin, UA: NEGATIVE
Glucose, UA: NEGATIVE
Leukocytes, UA: NEGATIVE
Nitrite, UA: NEGATIVE
PH UA: 5
PROTEIN UA: NEGATIVE
SPEC GRAV UA: 1.01
Urobilinogen, UA: 0.2

## 2015-07-31 LAB — URIC ACID: URIC ACID, SERUM: 8 mg/dL — AB (ref 2.4–7.0)

## 2015-07-31 MED ORDER — LEVOTHYROXINE SODIUM 100 MCG PO TABS: ORAL_TABLET | ORAL | Status: DC

## 2015-07-31 MED ORDER — CHOLESTYRAMINE 4 G PO PACK: 4.0000 g | PACK | Freq: Two times a day (BID) | ORAL | Status: DC

## 2015-07-31 MED ORDER — ALLOPURINOL 100 MG PO TABS: 100.0000 mg | ORAL_TABLET | Freq: Two times a day (BID) | ORAL | Status: DC

## 2015-07-31 MED ORDER — TRIAMTERENE-HCTZ 37.5-25 MG PO TABS: ORAL_TABLET | ORAL | Status: DC

## 2015-07-31 MED ORDER — ALPRAZOLAM 0.25 MG PO TABS: 0.2500 mg | ORAL_TABLET | Freq: Every evening | ORAL | Status: DC | PRN

## 2015-07-31 MED ORDER — LEVOTHYROXINE SODIUM 88 MCG PO TABS: 88.0000 ug | ORAL_TABLET | ORAL | Status: DC

## 2015-07-31 NOTE — Progress Notes (Signed)
Subjective:    Patient ID: Stacy Hart, female    DOB: 21-Sep-1956, 59 y.o.   MRN: [id number]  07/31/2015  follow up cholesterol   HPI This 59 y.o. female presents for one year follow-up:  1. Hypothyroidism: Patient reports good compliance with medication, good tolerance to medication, and good symptom control.    2.  Gout: Patient reports good compliance with medication, good tolerance to medication, and good symptom control.    3.  Allergic Rhinitis: not taking Flonase.  4.  SVT; Metoprolol PRN  5.  Venous stasis: taking Maxzide daily.   6. Family history of colon cancer: sister died of colon cancer this year at age 26.  Stacy Hart.  Went fast; died within two weeks of dx.  Died on birthday.    7.  Dyslipidemia: has gained weight since last visit one year ago.    8.  UTI: last week, awoke in middle of night to urinate.  Urgency to urination that morning. +dysuria. +hematuria.  Urine cx + enterobacter;prescribed Macrobid.  Started taking it last week; after first day, improved.  Had blood for an additional day.     Scheduled for mammogram and pap smear in 09/2015.    9. Plantar fasciitis:   10. Palpitations: occurs upon awakening frequently; s/p ablation for SVT; cannot tolerate Metoprolol well due to side effects.    Review of Systems  Constitutional: Negative for fever, chills, diaphoresis and fatigue.  Eyes: Negative for visual disturbance.  Respiratory: Negative for cough and shortness of breath.   Cardiovascular: Negative for chest pain, palpitations and leg swelling.  Gastrointestinal: Negative for nausea, vomiting, abdominal pain, diarrhea and constipation.  Endocrine: Negative for cold intolerance, heat intolerance, polydipsia, polyphagia and polyuria.  Neurological: Negative for dizziness, tremors, seizures, syncope, facial asymmetry, speech difficulty, weakness, light-headedness, numbness and headaches.    Past Medical History  Diagnosis Date  .  Symptomatic menopausal or female climacteric states   . Unspecified vitamin D deficiency   . Unspecified essential hypertension   . Mixed hyperlipidemia   . Benign paroxysmal positional vertigo   . Acquired keratoderma   . Pain in limb   . Esophageal reflux   . Unspecified asthma(493.90)   . Gout, unspecified   . Unspecified hypothyroidism   . Personal history of other diseases of circulatory system   . Other specified conditions influencing health status(V49.89)   . Cleft palate   . Chicken pox     childhood  . Measles     childhood  . Allergy   . Arthritis   . Anxiety   . Vitreous detachment of right eye 03/21/2014    Stacy Hart   Past Surgical History  Procedure Laterality Date  . Surgeries for cleft palatte and lips      [facility]  . Heart ablation  1996    SVT  . Gallbladder surgery  1998  . Abdominal hysterectomy  2001    Ovaries intact, DUB, uterine fibroids  . Foot surgery  2009    Left   Morton's Neuroma  . Abdominal hysterectomy    . Tubal ligation    . Cosmetic surgery     Allergies  Allergen Reactions  . Epinephrine     "interferes with heart"  . Sulfa Antibiotics   . Ceftin [Cefuroxime Axetil] Rash   Current Outpatient Prescriptions  Medication Sig Dispense Refill  . allopurinol (ZYLOPRIM) 100 MG tablet Take 1 tablet (100 mg total) by mouth 2 (two) times daily. 180 tablet 3  .  cholestyramine (QUESTRAN) 4 G packet Take 1 packet (4 g total) by mouth 2 (two) times daily. 180 each 3  . estradiol (ESTRACE) 0.5 MG tablet Take 2 mg by mouth daily. PATIENT NEEDS OFFICE VISIT FOR ADDITIONAL REFILLS    . levothyroxine (SYNTHROID, LEVOTHROID) 100 MCG tablet Take 1 tab by mouth every day, 5 days a week. PATIENT NEEDS OFFICE VISIT/LABS FOR ADDITIONAL REFILLS 30 tablet 0  . levothyroxine (SYNTHROID, LEVOTHROID) 88 MCG tablet Take 1 tablet (88 mcg total) by mouth 2 (two) times a week. 90 tablet 3  . triamterene-hydrochlorothiazide (MAXZIDE-25) 37.5-25 MG tablet TAKE ONE  TABLET BY MOUTH ONE TIME DAILY  "NEEDS OFFICE VISIT FOR  REFILLS' 30 tablet 0  . ALPRAZolam (XANAX) 0.25 MG tablet Take 1 tablet (0.25 mg total) by mouth at bedtime as needed. (Patient not taking: Reported on 07/25/2015) 30 tablet 0  . fenofibrate (TRICOR) 145 MG tablet Take 1 tablet (145 mg total) by mouth daily. (Patient not taking: Reported on 07/25/2015) 90 tablet 3  . metoprolol (LOPRESSOR) 50 MG tablet Take 1 tablet (50 mg total) by mouth daily. (Patient not taking: Reported on 07/25/2015) 30 tablet 0   No current facility-administered medications for this visit.   Social History   Social History  . Marital Status: Married    Spouse Name: N/A  . Number of Children: 2  . Years of Education: 12   Occupational History  . Full time Parent    Social History Main Topics  . Smoking status: Former Smoker -- 2.00 packs/day for 7 years    Types: Cigarettes  . Smokeless tobacco: Not on file     Comment: quit 28 yrs ago  . Alcohol Use: Yes     Comment: occasonal once a week  . Drug Use: No  . Sexual Activity: Yes   Other Topics Concern  . Not on file   Social History Narrative   Marital status:  Married x 29 years,happily, no domestic abuse.       Children: 2 daughters; no grandchildren.      Lives: with husband, daughter.      Employment: homemaker.      Tobacco:        Alcohol:       Drugs:       Guns:  No guns in the home. Smoke alarm in the home. Caffeine use: None. Well balanced diet.     Exercise history: Light, walking , 20 min., 2 - 3 times weekly. Organ donor: NO. Pt DOES have Living Will. DOES have HCPOA.   Family History  Problem Relation Age of Onset  . Heart disease Mother   . Stroke Mother   . Depression Mother   . Diabetes Mother   . Cancer Mother   . Heart disease Father   . Diabetes Sister   . Heart disease Sister   . Diabetes Brother   . Pulmonary embolism Brother   . Deep vein thrombosis Brother   . Stroke Brother     CVA  . Hypertension Sister   .  Depression Sister   . Hyperlipidemia Sister   . Anxiety disorder Sister        Objective:    BP 154/87 mmHg  Pulse 81  Temp(Src) 98.2 F (36.8 C) (Oral)  Resp 16  Ht 5\' 4"  (1.626 m)  Wt 161 lb 6.4 oz (73.211 kg)  BMI 27.69 kg/m2 Physical Exam  Constitutional: She is oriented to person, place, and time. She appears well-developed and  well-nourished. No distress.  HENT:  Head: Normocephalic and atraumatic.  Right Ear: External ear normal.  Left Ear: External ear normal.  Nose: Nose normal.  Mouth/Throat: Oropharynx is clear and moist.  Eyes: Conjunctivae and EOM are normal. Pupils are equal, round, and reactive to light.  Neck: Normal range of motion. Neck supple. Carotid bruit is not present. No thyromegaly present.  Cardiovascular: Normal rate, regular rhythm, normal heart sounds and intact distal pulses.  Exam reveals no gallop and no friction rub.   No murmur heard. Pulmonary/Chest: Effort normal and breath sounds normal. She has no wheezes. She has no rales.  Abdominal: Soft. Bowel sounds are normal. She exhibits no distension and no mass. There is no tenderness. There is no rebound and no guarding.  Lymphadenopathy:    She has no cervical adenopathy.  Neurological: She is alert and oriented to person, place, and time. No cranial nerve deficit.  Skin: Skin is warm and dry. No rash noted. She is not diaphoretic. No erythema. No pallor.  Psychiatric: She has a normal mood and affect. Her behavior is normal.   Results for orders placed or performed in visit on 07/25/15  Urine culture  Result Value Ref Range   Culture ENTEROBACTER AEROGENES    Colony Count >=100,000 COLONIES/ML    Organism ID, Bacteria ENTEROBACTER AEROGENES       Susceptibility   Enterobacter aerogenes -  (no method available)    AMOX/CLAVULANIC >=32 Resistant     PIP/TAZO >=128 Resistant     IMIPENEM <=0.25 Sensitive     CEFAZOLIN >=64 Resistant     CEFTRIAXONE 8 Resistant     CEFTAZIDIME 16  Resistant     CEFEPIME <=1 Sensitive     GENTAMICIN <=1 Sensitive     TOBRAMYCIN <=1 Sensitive     CIPROFLOXACIN <=0.25 Sensitive     LEVOFLOXACIN <=0.12 Sensitive     NITROFURANTOIN 64 Intermediate     TRIMETH/SULFA* <=20 Sensitive      * NR=NOT REPORTABLE,SEE COMMENTORAL therapy:A cefazolin MIC of <32 predicts susceptibility to the oral agents cefaclor,cefdinir,cefpodoxime,cefprozil,cefuroxime,cephalexin,and loracarbef when used for therapy of uncomplicated UTIs due to E.coli,K.pneumomiae,and P.mirabilis. PARENTERAL therapy: A cefazolinMIC of >8 indicates resistance to parenteralcefazolin. An alternate test method must beperformed to confirm susceptibility to parenteralcefazolin.  POCT Microscopic Urinalysis ([facility])  Result Value Ref Range   WBC,UR,HPF,POC Few (A) None WBC/hpf   RBC,UR,HPF,POC Many (A) None RBC/hpf   Bacteria None None   Mucus Absent Absent   Epithelial Cells, UR Per Microscopy Few (A) None cells/hpf  POCT urinalysis dipstick  Result Value Ref Range   Color, UA red (A) yellow   Clarity, UA cloudy (A) clear   Glucose, UA negative negative   Bilirubin, UA moderate (A) negative   Ketones, POC UA negative negative   Spec Grav, UA 1.020    Blood, UA large (A) negative   pH, UA 5.5    Protein Ur, POC >=300 (A) negative   Urobilinogen, UA 1.0    Nitrite, UA Negative Negative   Leukocytes, UA Trace (A) Negative       Assessment & Plan:   1. Hypothyroidism due to acquired atrophy of thyroid   2. SVT (supraventricular tachycardia) (HCC)   3. Acute idiopathic gout, unspecified site   4. Mixed hyperlipidemia   5. Family history of celiac disease     No orders of the defined types were placed in this encounter.   No orders of the defined types were placed in this encounter.  No Follow-up on file.     Chantry Headen Paulita Fujita, M.D. [facility] [address] [place], [place]  [address] [phone] phone [phone] fax

## 2015-08-01 ENCOUNTER — Telehealth: Payer: Self-pay | Admitting: Family Medicine

## 2015-08-01 LAB — TSH: TSH: 8.027 u[IU]/mL — ABNORMAL HIGH (ref 0.350–4.500)

## 2015-08-01 LAB — T4, FREE: Free T4: 0.99 ng/dL (ref 0.80–1.80)

## 2015-08-01 NOTE — Telephone Encounter (Signed)
Patient states that she her itchy eye has returned and she is requesting a medication to help with that. She states that she has had a medication prescribed before for this issue.  [id number]

## 2015-08-01 NOTE — Telephone Encounter (Signed)
RTC

## 2015-08-02 ENCOUNTER — Telehealth: Payer: Self-pay | Admitting: Gastroenterology

## 2015-08-02 ENCOUNTER — Encounter: Payer: Self-pay | Admitting: Gastroenterology

## 2015-08-02 NOTE — Telephone Encounter (Signed)
Colonoscopy scheduled.

## 2015-08-02 NOTE — Telephone Encounter (Signed)
Received referral from [facility]. Patient has seen Dr. Juanda ChanceBrodie before and is requesting to see Dr. Christella HartiganJacobs. Referral is for colonoscopy. Will you accept this patient? Thanks.

## 2015-08-02 NOTE — Telephone Encounter (Signed)
Yes, happy to.  

## 2015-08-03 ENCOUNTER — Encounter: Payer: Self-pay | Admitting: Gastroenterology

## 2015-08-04 ENCOUNTER — Telehealth: Payer: Self-pay | Admitting: Family Medicine

## 2015-08-04 MED ORDER — CIPROFLOXACIN HCL 250 MG PO TABS: 250.0000 mg | ORAL_TABLET | Freq: Two times a day (BID) | ORAL | Status: AC

## 2015-08-04 MED ORDER — NITROFURANTOIN MONOHYD MACRO 100 MG PO CAPS: 100.0000 mg | ORAL_CAPSULE | Freq: Two times a day (BID) | ORAL | Status: DC

## 2015-08-04 NOTE — Telephone Encounter (Signed)
Meds ordered this encounter  Medications  . ciprofloxacin (CIPRO) 250 MG tablet    Sig: Take 1 tablet (250 mg total) by mouth 2 (two) times daily.    Dispense:  10 tablet    Refill:  0    Order Specific Question:  Supervising Provider    Answer:  DOOLITTLE, ROBERT P [[id number]]

## 2015-08-04 NOTE — Telephone Encounter (Signed)
Patient was seen for a UTI on 07/25/2015 and 07/31/2015. She states that since her last two visits that her UTI has returned. Can she be prescribed more medication?   [phone]

## 2015-08-04 NOTE — Telephone Encounter (Signed)
Pt.notified

## 2015-08-04 NOTE — Telephone Encounter (Signed)
I spoke to pt. She had called to check bc she never got a call back from [facility]. Pt stated that she doesn't do well on Cipro, and doesn't want to take sulfa drugs bc she had a reaction (skin was hot) when she took it once and has strong family hx of severe allergic reactions to sulfa. Dr Katrinka BlazingSmith, please review. Also pt wanted to make sure that med Rxd is not one that would tend to increase her heart rate.

## 2015-08-04 NOTE — Telephone Encounter (Signed)
Urine culture done on 10/4

## 2015-08-04 NOTE — Telephone Encounter (Signed)
Sent in rx for Macrobid for patient.  Urine from visit this week looked much better from visit on 07/25/15.

## 2015-08-04 NOTE — Telephone Encounter (Signed)
PT called again to confirm if any additional RX would be written from her urine sample results from 07/31/15.  Please contact Pt to advise.  [phone]

## 2015-08-09 MED ORDER — ALLOPURINOL 300 MG PO TABS: 300.0000 mg | ORAL_TABLET | Freq: Every day | ORAL | Status: DC

## 2015-08-18 ENCOUNTER — Ambulatory Visit: Payer: Self-pay | Admitting: Cardiology

## 2015-08-23 ENCOUNTER — Encounter: Payer: Self-pay | Admitting: Internal Medicine

## 2015-08-23 ENCOUNTER — Ambulatory Visit (INDEPENDENT_AMBULATORY_CARE_PROVIDER_SITE_OTHER): Payer: BLUE CROSS/BLUE SHIELD | Admitting: Internal Medicine

## 2015-08-23 VITALS — BP 130/90 | HR 79 | Ht 64.0 in | Wt 157.4 lb

## 2015-08-23 DIAGNOSIS — I10 Essential (primary) hypertension: Secondary | ICD-10-CM

## 2015-08-23 DIAGNOSIS — I471 Supraventricular tachycardia: Secondary | ICD-10-CM

## 2015-08-23 DIAGNOSIS — E782 Mixed hyperlipidemia: Secondary | ICD-10-CM

## 2015-08-23 NOTE — Progress Notes (Signed)
HPI Stacy Hart returns today after a 17 year absence from our arrhythmia clinic. She is a very pleasant 59 year old woman with a history of tachycardia palpitations dating back to the 1990s. She was found to have SVT, refractory to medical therapy, and underwent catheter ablation of AV node reentrant tachycardia in 1999. She has done well from an arrhythmia perspective in the interim. Several weeks ago, she noted palpitations which awoke her from sleep. She started taking aspirin and notes that she felt better. During that time she was diagnosed with a urinary tract infection, and notes that she has been under increased stress. She thinks that her heart rate may be up to 110 or 120 bpm. Her current episodes are quite different from her episodes that she had with her SVT. Allergies  Allergen Reactions  . Epinephrine     "interferes with heart"  . Sulfa Antibiotics   . Ceftin [Cefuroxime Axetil] Rash     Current Outpatient Prescriptions  Medication Sig Dispense Refill  . allopurinol (ZYLOPRIM) 300 MG tablet Take 1 tablet (300 mg total) by mouth daily. 90 tablet 3  . cholestyramine (QUESTRAN) 4 G packet Take 1 packet (4 g total) by mouth 2 (two) times daily. 180 each 3  . estradiol (ESTRACE) 2 MG tablet Take 2 mg by mouth daily.  5  . levothyroxine (SYNTHROID, LEVOTHROID) 100 MCG tablet Take 1 tab by mouth every day, 5 days a week. 90 tablet 1  . levothyroxine (SYNTHROID, LEVOTHROID) 88 MCG tablet Take 1 tablet (88 mcg total) by mouth 2 (two) times a week. 90 tablet 1  . triamterene-hydrochlorothiazide (MAXZIDE-25) 37.5-25 MG tablet TAKE ONE TABLET BY MOUTH ONE TIME DAILY 90 tablet 3   No current facility-administered medications for this visit.     Past Medical History  Diagnosis Date  . Symptomatic menopausal or female climacteric states   . Unspecified vitamin D deficiency   . Unspecified essential hypertension   . Mixed hyperlipidemia   . Benign paroxysmal positional  vertigo   . Acquired keratoderma   . Pain in limb   . Esophageal reflux   . Unspecified asthma(493.90)   . Gout, unspecified   . Unspecified hypothyroidism   . Personal history of other diseases of circulatory system   . Other specified conditions influencing health status(V49.89)   . Cleft palate   . Chicken pox     childhood  . Measles     childhood  . Allergy   . Arthritis   . Anxiety   . Vitreous detachment of right eye 03/21/2014    [facility]    ROS:   All systems reviewed and negative except as noted in the HPI.   Past Surgical History  Procedure Laterality Date  . Surgeries for cleft palatte and lips      [facility]  . Heart ablation  1996    SVT  . Gallbladder surgery  1998  . Abdominal hysterectomy  2001    Ovaries intact, DUB, uterine fibroids  . Foot surgery  2009    Left   Morton's Neuroma  . Abdominal hysterectomy    . Tubal ligation    . Cosmetic surgery       Family History  Problem Relation Age of Onset  . Heart disease Mother   . Stroke Mother   . Depression Mother   . Diabetes Mother   . Cancer Mother   . Heart disease Father   . Diabetes Sister   . Heart  disease Sister   . Diabetes Brother   . Pulmonary embolism Brother   . Deep vein thrombosis Brother   . Stroke Brother     CVA  . Hypertension Sister   . Depression Sister   . Hyperlipidemia Sister   . Anxiety disorder Sister      Social History   Social History  . Marital Status: Married    Spouse Name: N/A  . Number of Children: 2  . Years of Education: 12   Occupational History  . Full time Parent    Social History Main Topics  . Smoking status: Former Smoker -- 2.00 packs/day for 7 years    Types: Cigarettes  . Smokeless tobacco: Not on file     Comment: quit 28 yrs ago  . Alcohol Use: Yes     Comment: occasonal once a week  . Drug Use: No  . Sexual Activity: Yes   Other Topics Concern  . Not on file   Social History Narrative   Marital status:  Married x 29  years,happily, no domestic abuse.       Children: 2 daughters; no grandchildren.      Lives: with husband, daughter.      Employment: homemaker.      Tobacco:        Alcohol:       Drugs:       Guns:  No guns in the home. Smoke alarm in the home. Caffeine use: None. Well balanced diet.     Exercise history: Light, walking , 20 min., 2 - 3 times weekly. Organ donor: NO. Pt DOES have Living Will. DOES have HCPOA.     BP 130/90 mmHg  Pulse 79  Ht  (1.626 m)  Wt 157 lb 6.4 oz (71.396 kg)  BMI 27.00 kg/m2  Physical Exam:  Well appearing 59 year old woman, NAD HEENT: Unremarkable Neck:  No JVD, no thyromegally Back:  No CVA tenderness Lungs:  Clear, with no wheezes, rales, or rhonchi. HEART:  Regular rate rhythm, no murmurs, no rubs, no clicks Abd:  soft, positive bowel sounds, no organomegally, no rebound, no guarding Ext:  2 plus pulses, no edema, no cyanosis, no clubbing Skin:  No rashes no nodules Neuro:  CN II through XII intact, motor grossly intact  EKG - normal sinus rhythm with nonspecific ST-T wave abnormality. Poor R-wave progression.  Assess/Plan:

## 2015-08-23 NOTE — Patient Instructions (Signed)
Your physician recommends that you continue on your current medications as directed. Please refer to the Current Medication list given to you today. Your physician recommends that you schedule a follow-up appointment as needed with Dr. Taylor.  

## 2015-08-23 NOTE — Assessment & Plan Note (Signed)
Her blood pressure is slightly elevated today. She thinks at home that is well controlled. She will continue her current medications, and I've asked her to maintain a low-sodium diet.

## 2015-08-23 NOTE — Assessment & Plan Note (Signed)
She will continue her lipid lowering medications. She is encouraged to lose weight and maintain a low-fat diet.

## 2015-08-23 NOTE — Assessment & Plan Note (Signed)
Her palpitation do not sound like recurrent SVT. I have recommended she undergo watchful waiting. If she has more palpitations then we would ask her to wear a cardiac monitor. She is encouraged to maintain a low caffeine diet. She will continue her beta blocker for hypertension.

## 2015-08-27 ENCOUNTER — Other Ambulatory Visit: Payer: Self-pay | Admitting: Family Medicine

## 2015-08-30 ENCOUNTER — Other Ambulatory Visit: Payer: Self-pay | Admitting: Physician Assistant

## 2015-08-31 NOTE — Telephone Encounter (Signed)
Patient called to check the status of her RX refill

## 2015-08-31 NOTE — Telephone Encounter (Signed)
Patient saw Dr. Katrinka BlazingSmith  Needs refill on allopurinol (ZYLOPRIM) 300 MG tablet

## 2015-09-07 ENCOUNTER — Other Ambulatory Visit: Payer: Self-pay | Admitting: Family Medicine

## 2015-10-02 ENCOUNTER — Other Ambulatory Visit: Payer: Self-pay | Admitting: Family Medicine

## 2015-10-03 NOTE — Telephone Encounter (Signed)
Dr Katrinka BlazingSmith, I wanted to check w/you on current correct dosage of allopurinol. At 10/10 OV, allopurinol was listed as BID dosing on med list, but then you ordered it at OV as just QD dosing. No notes on decreasing dose though and pt reported good effectiveness and tolerance. Did you mean to send in #180, BID instead of #90, QD? Sending over RF req from pharmacy for BID dosing.

## 2015-10-05 NOTE — Telephone Encounter (Signed)
Refill for bid dosing sent to pharmacy.

## 2015-10-17 ENCOUNTER — Encounter: Payer: Self-pay | Admitting: Gastroenterology

## 2015-11-30 ENCOUNTER — Other Ambulatory Visit: Payer: Self-pay | Admitting: Family Medicine

## 2016-02-09 ENCOUNTER — Encounter: Payer: Self-pay | Admitting: Family Medicine

## 2016-02-16 DIAGNOSIS — H43813 Vitreous degeneration, bilateral: Secondary | ICD-10-CM | POA: Diagnosis not present

## 2016-02-16 DIAGNOSIS — H35411 Lattice degeneration of retina, right eye: Secondary | ICD-10-CM | POA: Diagnosis not present

## 2016-02-26 ENCOUNTER — Other Ambulatory Visit: Payer: Self-pay | Admitting: Family Medicine

## 2016-03-05 ENCOUNTER — Ambulatory Visit (INDEPENDENT_AMBULATORY_CARE_PROVIDER_SITE_OTHER): Payer: BLUE CROSS/BLUE SHIELD

## 2016-03-05 ENCOUNTER — Encounter: Payer: Self-pay | Admitting: Family Medicine

## 2016-03-05 ENCOUNTER — Ambulatory Visit (INDEPENDENT_AMBULATORY_CARE_PROVIDER_SITE_OTHER): Payer: BLUE CROSS/BLUE SHIELD | Admitting: Family Medicine

## 2016-03-05 VITALS — BP 140/83 | HR 86 | Temp 97.7°F | Resp 16 | Ht 64.0 in | Wt 156.4 lb

## 2016-03-05 DIAGNOSIS — I10 Essential (primary) hypertension: Secondary | ICD-10-CM

## 2016-03-05 DIAGNOSIS — E782 Mixed hyperlipidemia: Secondary | ICD-10-CM | POA: Diagnosis not present

## 2016-03-05 DIAGNOSIS — Z1211 Encounter for screening for malignant neoplasm of colon: Secondary | ICD-10-CM

## 2016-03-05 DIAGNOSIS — R059 Cough, unspecified: Secondary | ICD-10-CM

## 2016-03-05 DIAGNOSIS — Z8379 Family history of other diseases of the digestive system: Secondary | ICD-10-CM | POA: Diagnosis not present

## 2016-03-05 DIAGNOSIS — M1A00X0 Idiopathic chronic gout, unspecified site, without tophus (tophi): Secondary | ICD-10-CM

## 2016-03-05 DIAGNOSIS — Z1159 Encounter for screening for other viral diseases: Secondary | ICD-10-CM | POA: Diagnosis not present

## 2016-03-05 DIAGNOSIS — M1000 Idiopathic gout, unspecified site: Secondary | ICD-10-CM | POA: Diagnosis not present

## 2016-03-05 DIAGNOSIS — R05 Cough: Secondary | ICD-10-CM

## 2016-03-05 DIAGNOSIS — I471 Supraventricular tachycardia: Secondary | ICD-10-CM

## 2016-03-05 DIAGNOSIS — Z131 Encounter for screening for diabetes mellitus: Secondary | ICD-10-CM | POA: Diagnosis not present

## 2016-03-05 DIAGNOSIS — E034 Atrophy of thyroid (acquired): Secondary | ICD-10-CM

## 2016-03-05 DIAGNOSIS — Z0000 Encounter for general adult medical examination without abnormal findings: Secondary | ICD-10-CM | POA: Diagnosis not present

## 2016-03-05 DIAGNOSIS — E038 Other specified hypothyroidism: Secondary | ICD-10-CM

## 2016-03-05 LAB — CBC WITH DIFFERENTIAL/PLATELET
Basophils Absolute: 101 cells/uL (ref 0–200)
Basophils Relative: 1 %
EOS PCT: 4 %
Eosinophils Absolute: 404 cells/uL (ref 15–500)
HCT: 45.6 % — ABNORMAL HIGH (ref 35.0–45.0)
Hemoglobin: 15.6 g/dL — ABNORMAL HIGH (ref 11.7–15.5)
LYMPHS ABS: 3838 {cells}/uL (ref 850–3900)
LYMPHS PCT: 38 %
MCH: 31.1 pg (ref 27.0–33.0)
MCHC: 34.2 g/dL (ref 32.0–36.0)
MCV: 90.8 fL (ref 80.0–100.0)
MONOS PCT: 6 %
MPV: 10.9 fL (ref 7.5–12.5)
Monocytes Absolute: 606 cells/uL (ref 200–950)
NEUTROS PCT: 51 %
Neutro Abs: 5151 cells/uL (ref 1500–7800)
Platelets: 232 10*3/uL (ref 140–400)
RBC: 5.02 MIL/uL (ref 3.80–5.10)
RDW: 13 % (ref 11.0–15.0)
WBC: 10.1 10*3/uL (ref 3.8–10.8)

## 2016-03-05 LAB — LIPID PANEL
CHOL/HDL RATIO: 5.3 ratio — AB (ref <=5.0)
Cholesterol: 170 mg/dL (ref 125–200)
HDL: 32 mg/dL — AB (ref >=46)
Triglycerides: 538 mg/dL — ABNORMAL HIGH (ref <150)

## 2016-03-05 LAB — POCT URINALYSIS DIP (MANUAL ENTRY)
BILIRUBIN UA: NEGATIVE
GLUCOSE UA: NEGATIVE
Ketones, POC UA: NEGATIVE
Leukocytes, UA: NEGATIVE
NITRITE UA: NEGATIVE
Protein Ur, POC: NEGATIVE
Spec Grav, UA: 1.015
Urobilinogen, UA: 0.2
pH, UA: 5.5

## 2016-03-05 LAB — POC MICROSCOPIC URINALYSIS (UMFC): MUCUS RE: ABSENT

## 2016-03-05 LAB — COMPREHENSIVE METABOLIC PANEL
ALT: 18 U/L (ref 6–29)
AST: 22 U/L (ref 10–35)
Albumin: 4.2 g/dL (ref 3.6–5.1)
Alkaline Phosphatase: 38 U/L (ref 33–130)
BILIRUBIN TOTAL: 0.7 mg/dL (ref 0.2–1.2)
BUN: 12 mg/dL (ref 7–25)
CHLORIDE: 97 mmol/L — AB (ref 98–110)
CO2: 26 mmol/L (ref 20–31)
CREATININE: 0.6 mg/dL (ref 0.50–0.99)
Calcium: 9.3 mg/dL (ref 8.6–10.4)
GLUCOSE: 100 mg/dL — AB (ref 65–99)
Potassium: 3.9 mmol/L (ref 3.5–5.3)
SODIUM: 137 mmol/L (ref 135–146)
Total Protein: 7.6 g/dL (ref 6.1–8.1)

## 2016-03-05 LAB — URIC ACID: URIC ACID, SERUM: 7.6 mg/dL — AB (ref 2.4–7.0)

## 2016-03-05 LAB — TSH: TSH: 10.6 m[IU]/L — AB

## 2016-03-05 LAB — HEMOGLOBIN A1C
Hgb A1c MFr Bld: 5.5 % (ref <5.7)
Mean Plasma Glucose: 111 mg/dL

## 2016-03-05 LAB — HEPATITIS C ANTIBODY: HCV Ab: NEGATIVE

## 2016-03-05 LAB — T4, FREE: FREE T4: 1.1 ng/dL (ref 0.8–1.8)

## 2016-03-05 MED ORDER — ALPRAZOLAM 0.5 MG PO TABS: 0.5000 mg | ORAL_TABLET | Freq: Every evening | ORAL | Status: DC | PRN

## 2016-03-05 MED ORDER — CHOLESTYRAMINE 4 G PO PACK: PACK | ORAL | Status: DC

## 2016-03-05 MED ORDER — LEVOTHYROXINE SODIUM 100 MCG PO TABS: ORAL_TABLET | ORAL | Status: DC

## 2016-03-05 MED ORDER — TRIAMTERENE-HCTZ 37.5-25 MG PO TABS: ORAL_TABLET | ORAL | Status: DC

## 2016-03-05 MED ORDER — MAGIC MOUTHWASH W/LIDOCAINE: 10.0000 mL | Freq: Four times a day (QID) | ORAL | Status: DC | PRN

## 2016-03-05 MED ORDER — AZELASTINE HCL 0.1 % NA SOLN: 2.0000 | Freq: Two times a day (BID) | NASAL | Status: DC

## 2016-03-05 MED ORDER — ALLOPURINOL 300 MG PO TABS: 300.0000 mg | ORAL_TABLET | Freq: Every day | ORAL | Status: DC

## 2016-03-05 NOTE — Patient Instructions (Addendum)
Keeping You Healthy  Get These Tests  Blood Pressure- Have your blood pressure checked by your healthcare provider at least once a year.  Normal blood pressure is 120/80.  Weight- Have your body mass index (BMI) calculated to screen for obesity.  BMI is a measure of body fat based on height and weight.  You can calculate your own BMI at www.nhlbisupport.com/bmi/  Cholesterol- Have your cholesterol checked every year.  Diabetes- Have your blood sugar checked every year if you have high blood pressure, high cholesterol, a family history of diabetes or if you are overweight.  Pap Test - Have a pap test every 1 to 5 years if you have been sexually active.  If you are older than 65 and recent pap tests have been normal you may not need additional pap tests.  In addition, if you have had a hysterectomy  for benign disease additional pap tests are not necessary.  Mammogram-Yearly mammograms are essential for early detection of breast cancer  Screening for Colon Cancer- Colonoscopy starting at age 50. Screening may begin sooner depending on your family history and other health conditions.  Follow up colonoscopy as directed by your Gastroenterologist.  Screening for Osteoporosis- Screening begins at age 65 with bone density scanning, sooner if you are at higher risk for developing Osteoporosis.  Get these medicines  Calcium with Vitamin D- Your body requires 1200-1500 mg of Calcium a day and 800-1000 IU of Vitamin D a day.  You can only absorb 500 mg of Calcium at a time therefore Calcium must be taken in 2 or 3 separate doses throughout the day.  Hormones- Hormone therapy has been associated with increased risk for certain cancers and heart disease.  Talk to your healthcare provider about if you need relief from menopausal symptoms.  Aspirin- Ask your healthcare provider about taking Aspirin to prevent Heart Disease and Stroke.  Get these Immuniztions  Flu shot- Every fall  Pneumonia shot-  Once after the age of 65; if you are younger ask your healthcare provider if you need a pneumonia shot.  Tetanus- Every ten years.  Zostavax- Once after the age of 60 to prevent shingles.  Take these steps  Don't smoke- Your healthcare provider can help you quit. For tips on how to quit, ask your healthcare provider or go to www.smokefree.gov or call 1-800 QUIT-NOW.  Be physically active- Exercise 5 days a week for a minimum of 30 minutes.  If you are not already physically active, start slow and gradually work up to 30 minutes of moderate physical activity.  Try walking, dancing, bike riding, swimming, etc.  Eat a healthy diet- Eat a variety of healthy foods such as fruits, vegetables, whole grains, low fat milk, low fat cheeses, yogurt, lean meats, chicken, fish, eggs, dried beans, tofu, etc.  For more information go to www.thenutritionsource.org  Dental visit- Brush and floss teeth twice daily; visit your dentist twice a year.  Eye exam- Visit your Optometrist or Ophthalmologist yearly.  Drink alcohol in moderation- Limit alcohol intake to one drink or less a day.  Never drink and drive.  Depression- Your emotional health is as important as your physical health.  If you're feeling down or losing interest in things you normally enjoy, please talk to your healthcare provider.  Seat Belts- can save your life; always wear one  Smoke/Carbon Monoxide detectors- These detectors need to be installed on the appropriate level of your home.  Replace batteries at least once a year.  Violence- If   anyone is threatening or hurting you, please tell your healthcare provider. Living Will/ Health care power of attorney- Discuss with your healthcare provider and family.    IF you received an x-ray today, you will receive an invoice from [facility]. Please contact [facility] at [phone] with questions or concerns regarding your invoice.   IF you received labwork today, you will  receive an invoice from [facility]. Please contact [facility] at [phone] with questions or concerns regarding your invoice.   Our billing staff will not be able to assist you with questions regarding bills from these companies.  You will be contacted with the lab results as soon as they are available. The fastest way to get your results is to activate your My Chart account. Instructions are located on the last page of this paperwork. If you have not heard from us regarding the results in 2 weeks, please contact this office.    Keeping You Healthy  Get These Tests Blood Pressure- Have your blood pressure checked by your healthcare provider at least once a year.  Normal blood pressure is 120/80. Weight- Have your body mass index (BMI) calculated to screen for obesity.  BMI is a measure of body fat based on height and weight.  You can calculate your own BMI at www.nhlbisupport.com/bmi/ Cholesterol- Have your cholesterol checked every year. Diabetes- Have your blood sugar checked every year if you have high blood pressure, high cholesterol, a family history of diabetes or if you are overweight. Pap Test - Have a pap test every 1 to 5 years if you have been sexually active.  If you are older than 65 and recent pap tests have been normal you may not need additional pap tests.  In addition, if you have had a hysterectomy  for benign disease additional pap tests are not necessary. Mammogram-Yearly mammograms are essential for early detection of breast cancer Screening for Colon Cancer- Colonoscopy starting at age 50. Screening may begin sooner depending on your family history and other health conditions.  Follow up colonoscopy as directed by your Gastroenterologist. Screening for Osteoporosis- Screening begins at age 65 with bone density scanning, sooner if you are at higher risk for developing Osteoporosis.  Get these medicines Calcium with Vitamin D- Your body requires  1200-1500 mg of Calcium a day and 800-1000 IU of Vitamin D a day.  You can only absorb 500 mg of Calcium at a time therefore Calcium must be taken in 2 or 3 separate doses throughout the day. Hormones- Hormone therapy has been associated with increased risk for certain cancers and heart disease.  Talk to your healthcare provider about if you need relief from menopausal symptoms. Aspirin- Ask your healthcare provider about taking Aspirin to prevent Heart Disease and Stroke.  Get these Immuniztions Flu shot- Every fall Pneumonia shot- Once after the age of 65; if you are younger ask your healthcare provider if you need a pneumonia shot. Tetanus- Every ten years. Zostavax- Once after the age of 60 to prevent shingles.  Take these steps Don't smoke- Your healthcare provider can help you quit. For tips on how to quit, ask your healthcare provider or go to www.smokefree.gov or call 1-800 QUIT-NOW. Be physically active- Exercise 5 days a week for a minimum of 30 minutes.  If you are not already physically active, start slow and gradually work up to 30 minutes of moderate physical activity.  Try walking, dancing, bike riding, swimming, etc. Eat a healthy diet- Eat a variety   of healthy foods such as fruits, vegetables, whole grains, low fat milk, low fat cheeses, yogurt, lean meats, chicken, fish, eggs, dried beans, tofu, etc.  For more information go to www.thenutritionsource.org Dental visit- Brush and floss teeth twice daily; visit your dentist twice a year. Eye exam- Visit your Optometrist or Ophthalmologist yearly. Drink alcohol in moderation- Limit alcohol intake to one drink or less a day.  Never drink and drive. Depression- Your emotional health is as important as your physical health.  If you're feeling down or losing interest in things you normally enjoy, please talk to your healthcare provider. Seat Belts- can save your life; always wear one Smoke/Carbon Monoxide detectors- These detectors need  to be installed on the appropriate level of your home.  Replace batteries at least once a year. Violence- If anyone is threatening or hurting you, please tell your healthcare provider. Living Will/ Health care power of attorney- Discuss with your healthcare provider and family. 

## 2016-03-05 NOTE — Progress Notes (Signed)
Subjective:    Patient ID: Stacy Hart, female    DOB: 14-Nov-1955, 60 y.o.   MRN: [id number]  03/05/2016  Annual Exam   HPI This 60 y.o. female presents for evaluation Complete Physical Examination.  Last physical:  04-12-2013 Pap smear:  07-04-2014 Mammogram:  07-04-2014 Colonoscopy:  never Bone density: 07-04-2014 TDAP: refuses Pneumovax:  never Zostavax: refuses Influenza: refuses Eye exam:  +glasses; June 2017 sanders left Dental exam:  Husband with pericarditis; father in law with dementia nad prostate cancer; broke hip two weeks ago.  Opted out of surgery. [place]; feeding.   Also has UTI father in law.  Not sleeping great.  Chronic anxiety; controls.    IBS diarrhea post-cholecystectomy: taking powder.  HTN: Patient reports good compliance with medication, good tolerance to medication, and good symptom control.    Nighttime cough: rare GERD.  Feels due to allergies.  Takes flonase sporadically.  Cannot tolerate oral antihistamine.    Stress: takes 1/2 of a half of Xanax.  Review of Systems  Constitutional: Negative for fever, chills, diaphoresis, activity change, appetite change, fatigue and unexpected weight change.  HENT: Positive for postnasal drip. Negative for congestion, dental problem, drooling, ear discharge, ear pain, facial swelling, hearing loss, mouth sores, nosebleeds, rhinorrhea, sinus pressure, sneezing, sore throat, tinnitus, trouble swallowing and voice change.   Eyes: Negative for photophobia, pain, discharge, redness, itching and visual disturbance.  Respiratory: Positive for cough. Negative for apnea, choking, chest tightness, shortness of breath, wheezing and stridor.   Cardiovascular: Negative for chest pain, palpitations and leg swelling.  Gastrointestinal: Negative for nausea, vomiting, abdominal pain, diarrhea, constipation, blood in stool, abdominal distention, anal bleeding and rectal pain.  Endocrine: Negative for cold  intolerance, heat intolerance, polydipsia, polyphagia and polyuria.  Genitourinary: Negative for dysuria, urgency, frequency, hematuria, flank pain, decreased urine volume, vaginal bleeding, vaginal discharge, enuresis, difficulty urinating, genital sores, vaginal pain, menstrual problem, pelvic pain and dyspareunia.  Musculoskeletal: Negative for myalgias, back pain, joint swelling, arthralgias, gait problem, neck pain and neck stiffness.  Skin: Negative for color change, pallor, rash and wound.  Allergic/Immunologic: Negative for environmental allergies, food allergies and immunocompromised state.  Neurological: Negative for dizziness, tremors, seizures, syncope, facial asymmetry, speech difficulty, weakness, light-headedness, numbness and headaches.  Hematological: Negative for adenopathy. Does not bruise/bleed easily.  Psychiatric/Behavioral: Negative for suicidal ideas, hallucinations, behavioral problems, confusion, sleep disturbance, self-injury, dysphoric mood, decreased concentration and agitation. The patient is nervous/anxious. The patient is not hyperactive.     Past Medical History  Diagnosis Date  . Symptomatic menopausal or female climacteric states   . Unspecified vitamin D deficiency   . Unspecified essential hypertension   . Mixed hyperlipidemia   . Benign paroxysmal positional vertigo   . Acquired keratoderma   . Pain in limb   . Esophageal reflux   . Unspecified asthma(493.90)   . Gout, unspecified   . Unspecified hypothyroidism   . Personal history of other diseases of circulatory system   . Other specified conditions influencing health status(V49.89)   . Cleft palate   . Chicken pox     childhood  . Measles     childhood  . Allergy   . Arthritis   . Anxiety   . Vitreous detachment of right eye 03/21/2014    [name]   Past Surgical History  Procedure Laterality Date  . Surgeries for cleft palatte and lips      [facility]  . Heart ablation  1996    [facility]  .  Gallbladder surgery  1998  . Abdominal hysterectomy  2001    Ovaries intact, DUB, uterine fibroids  . Foot surgery  2009    Left   Morton's Neuroma  . Abdominal hysterectomy    . Tubal ligation    . Cosmetic surgery     Allergies  Allergen Reactions  . Epinephrine     "interferes with heart"  . Sulfa Antibiotics   . Ceftin [Cefuroxime Axetil] Rash    Social History   Social History  . Marital Status: Married    Spouse Name: N/A  . Number of Children: 2  . Years of Education: 12   Occupational History  . Full time Parent    Social History Main Topics  . Smoking status: Former Smoker -- 2.00 packs/day for 7 years    Types: Cigarettes  . Smokeless tobacco: Not on file     Comment: quit 28 yrs ago  . Alcohol Use: Yes     Comment: occasonal once a week  . Drug Use: No  . Sexual Activity: Yes   Other Topics Concern  . Not on file   Social History Narrative   Marital status:  Married x 30 years,happily, no domestic abuse.       Children: 2 daughters; no grandchildren.      Lives: with husband, adult daughter.      Employment: homemaker.      Tobacco:  never      Alcohol: social      Drugs:none       Guns:  No guns in the home. Smoke alarm in the home. Caffeine use: None. Well balanced diet.     Exercise history: Light, walking , 20 min., 2 - 3 times weekly. Organ donor: NO. Pt DOES have Living Will. DOES have HCPOA.   Family History  Problem Relation Age of Onset  . Heart disease Mother   . Stroke Mother   . Depression Mother   . Diabetes Mother   . Cancer Mother   . Heart disease Father   . Diabetes Sister   . Heart disease Sister   . Diabetes Brother   . Pulmonary embolism Brother   . Deep vein thrombosis Brother   . Stroke Brother     CVA  . Hypertension Sister   . Depression Sister   . Hyperlipidemia Sister   . Anxiety disorder Sister        Objective:    BP 140/83 mmHg  Pulse 86  Temp(Src) 97.7 F (36.5 C) (Oral)  Resp 16  Ht 5\' 4"  (1.626  m)  Wt 156 lb 6.4 oz (70.943 kg)  BMI 26.83 kg/m2 Physical Exam  Constitutional: She is oriented to person, place, and time. She appears well-developed and well-nourished. No distress.  HENT:  Head: Normocephalic and atraumatic.  Right Ear: External ear normal.  Left Ear: External ear normal.  Nose: Nose normal.  Mouth/Throat: Oropharynx is clear and moist.  Eyes: Conjunctivae and EOM are normal. Pupils are equal, round, and reactive to light.  Neck: Normal range of motion and full passive range of motion without pain. Neck supple. No JVD present. Carotid bruit is not present. No thyromegaly present.  Cardiovascular: Normal rate, regular rhythm and normal heart sounds.  Exam reveals no gallop and no friction rub.   No murmur heard. Pulmonary/Chest: Effort normal and breath sounds normal. She has no wheezes. She has no rales.  Abdominal: Soft. Bowel sounds are normal. She exhibits no distension and no mass. There  is no tenderness. There is no rebound and no guarding.  Musculoskeletal:       Right shoulder: Normal.       Left shoulder: Normal.       Cervical back: Normal.  Lymphadenopathy:    She has no cervical adenopathy.  Neurological: She is alert and oriented to person, place, and time. She has normal reflexes. No cranial nerve deficit. She exhibits normal muscle tone. Coordination normal.  Skin: Skin is warm and dry. No rash noted. She is not diaphoretic. No erythema. No pallor.  Psychiatric: She has a normal mood and affect. Her behavior is normal. Judgment and thought content normal.  Nursing note and vitals reviewed.  Depression screen [facility] 2/9 03/05/2016 07/31/2015 07/25/2015 07/20/2014  Decreased Interest 0 0 0 0  Down, Depressed, Hopeless 0 0 0 0  PHQ - 2 Score 0 0 0 0        Assessment & Plan:   1. Routine physical examination   2. Idiopathic chronic gout without tophus, unspecified site   3. Family history of celiac disease   4. Hypothyroidism due to acquired atrophy  of thyroid   5. SVT (supraventricular tachycardia) (HCC)   6. Mixed hyperlipidemia   7. Essential hypertension   8. Need for hepatitis C screening test   9. Screening for diabetes mellitus   10. Acute idiopathic gout, unspecified site   11. Cough   12. Colon cancer screening     Orders Placed This Encounter  Procedures  . DG Chest 2 View    Standing Status: Future     Number of Occurrences: 1     Standing Expiration Date: 03/05/2017    Order Specific Question:  Reason for Exam (SYMPTOM  OR DIAGNOSIS REQUIRED)    Answer:  nighttime cough for two years    Order Specific Question:  Is the patient pregnant?    Answer:  No    Order Specific Question:  Preferred imaging location?    Answer:  External  . CBC with Differential/Platelet  . Comprehensive metabolic panel    Order Specific Question:  Has the patient fasted?    Answer:  Yes  . Hemoglobin A1c  . Lipid panel    Order Specific Question:  Has the patient fasted?    Answer:  Yes  . T4, free  . TSH  . Hepatitis C antibody  . Uric Acid  . VITAMIN D 25 Hydroxy (Vit-D Deficiency, Fractures)  . POCT urinalysis dipstick  . POCT Microscopic Urinalysis ([facility])  . POC Hemoccult Bld/Stl (3-Cd Home Screen)    Standing Status: Future     Number of Occurrences:      Standing Expiration Date: 03/05/2017   Meds ordered this encounter  Medications  . ALPRAZolam (XANAX) 0.5 MG tablet    Sig: Take 1 tablet (0.5 mg total) by mouth at bedtime as needed for anxiety.    Dispense:  30 tablet    Refill:  0  . triamterene-hydrochlorothiazide (MAXZIDE-25) 37.5-25 MG tablet    Sig: TAKE ONE TABLET BY MOUTH ONE TIME DAILY    Dispense:  90 tablet    Refill:  3  . levothyroxine (SYNTHROID, LEVOTHROID) 100 MCG tablet    Sig: TAKE 1 TABLET (100 MCG TOTAL) BY MOUTH DAILY.    Dispense:  90 tablet    Refill:  3  . cholestyramine (QUESTRAN) 4 g packet    Sig: USE 1 PACKET TWICE A DAY AS DIRECTED    Dispense:  180 packet  Refill:  3  .  allopurinol (ZYLOPRIM) 300 MG tablet    Sig: Take 1 tablet (300 mg total) by mouth daily.    Dispense:  90 tablet    Refill:  3  . azelastine (ASTELIN) 0.1 % nasal spray    Sig: Place 2 sprays into both nostrils 2 (two) times daily. Use in each nostril as directed    Dispense:  30 mL    Refill:  12  . magic mouthwash w/lidocaine SOLN    Sig: Take 10 mLs by mouth 4 (four) times daily as needed for mouth pain.    Dispense:  180 mL    Refill:  0    Return in about 6 months (around 09/05/2016) for recheck thyroid, cholesterol.    Zyan Mirkin Paulita FujitaMartin Doria Fern, M.D. [facility]  [place] [address] [place], [place]  [id number] [phone] phone [phone]) [phone] fax

## 2016-03-06 LAB — VITAMIN D 25 HYDROXY (VIT D DEFICIENCY, FRACTURES): Vit D, 25-Hydroxy: 20 ng/mL — ABNORMAL LOW (ref 30–100)

## 2016-04-01 DIAGNOSIS — H35411 Lattice degeneration of retina, right eye: Secondary | ICD-10-CM | POA: Diagnosis not present

## 2016-04-01 DIAGNOSIS — H35372 Puckering of macula, left eye: Secondary | ICD-10-CM | POA: Diagnosis not present

## 2016-04-01 DIAGNOSIS — H43813 Vitreous degeneration, bilateral: Secondary | ICD-10-CM | POA: Diagnosis not present

## 2016-04-03 ENCOUNTER — Encounter: Payer: Self-pay | Admitting: Family Medicine

## 2016-05-01 ENCOUNTER — Ambulatory Visit (INDEPENDENT_AMBULATORY_CARE_PROVIDER_SITE_OTHER): Payer: BLUE CROSS/BLUE SHIELD | Admitting: Family Medicine

## 2016-05-01 VITALS — BP 120/78 | HR 86 | Temp 99.0°F | Resp 16 | Ht 64.5 in | Wt 155.0 lb

## 2016-05-01 DIAGNOSIS — E782 Mixed hyperlipidemia: Secondary | ICD-10-CM | POA: Diagnosis not present

## 2016-05-01 DIAGNOSIS — F430 Acute stress reaction: Secondary | ICD-10-CM

## 2016-05-01 DIAGNOSIS — M1A00X0 Idiopathic chronic gout, unspecified site, without tophus (tophi): Secondary | ICD-10-CM | POA: Diagnosis not present

## 2016-05-01 DIAGNOSIS — I10 Essential (primary) hypertension: Secondary | ICD-10-CM

## 2016-05-01 DIAGNOSIS — I471 Supraventricular tachycardia: Secondary | ICD-10-CM | POA: Diagnosis not present

## 2016-05-01 DIAGNOSIS — Z8379 Family history of other diseases of the digestive system: Secondary | ICD-10-CM | POA: Diagnosis not present

## 2016-05-01 DIAGNOSIS — E034 Atrophy of thyroid (acquired): Secondary | ICD-10-CM

## 2016-05-01 DIAGNOSIS — E038 Other specified hypothyroidism: Secondary | ICD-10-CM

## 2016-05-01 MED ORDER — HYOSCYAMINE SULFATE ER 0.375 MG PO TB12: 0.3750 mg | ORAL_TABLET | Freq: Two times a day (BID) | ORAL | Status: DC | PRN

## 2016-05-01 MED ORDER — LEVOTHYROXINE SODIUM 88 MCG PO TABS: 88.0000 ug | ORAL_TABLET | Freq: Every day | ORAL | Status: DC

## 2016-05-01 MED ORDER — PREDNISONE 20 MG PO TABS: ORAL_TABLET | ORAL | Status: DC

## 2016-05-01 MED ORDER — LISINOPRIL 10 MG PO TABS: 10.0000 mg | ORAL_TABLET | Freq: Every day | ORAL | Status: DC

## 2016-05-01 NOTE — Patient Instructions (Addendum)
1. Stop diuretic. 2.      IF you received an x-ray today, you will receive an invoice from [facility]. Please contact [facility] at [phone] with questions or concerns regarding your invoice.   IF you received labwork today, you will receive an invoice from [employer]. Please contact [facility] at [phone] with questions or concerns regarding your invoice.   Our billing staff will not be able to assist you with questions regarding bills from these companies.  You will be contacted with the lab results as soon as they are available. The fastest way to get your results is to activate your My Chart account. Instructions are located on the last page of this paperwork. If you have not heard from [place] regarding the results in 2 weeks, please contact this office.     Plantar Fasciitis With Rehab The plantar fascia is a fibrous, ligament-like, soft-tissue structure that spans the bottom of the foot. Plantar fasciitis, also called heel spur syndrome, is a condition that causes pain in the foot due to inflammation of the tissue. SYMPTOMS   Pain and tenderness on the underneath side of the foot.  Pain that worsens with standing or walking. CAUSES  Plantar fasciitis is caused by irritation and injury to the plantar fascia on the underneath side of the foot. Common mechanisms of injury include:  Direct trauma to bottom of the foot.  Damage to a small nerve that runs under the foot where the main fascia attaches to the heel bone.  Stress placed on the plantar fascia due to bone spurs. RISK INCREASES WITH:   Activities that place stress on the plantar fascia (running, jumping, pivoting, or cutting).  Poor strength and flexibility.  Improperly fitted shoes.  Tight calf muscles.  Flat feet.  Failure to warm-up properly before activity.  Obesity. PREVENTION  Warm up and stretch properly before activity.  Allow for adequate recovery  between workouts.  Maintain physical fitness:  Strength, flexibility, and endurance.  Cardiovascular fitness.  Maintain a health body weight.  Avoid stress on the plantar fascia.  Wear properly fitted shoes, including arch supports for individuals who have flat feet. PROGNOSIS  If treated properly, then the symptoms of plantar fasciitis usually resolve without surgery. However, occasionally surgery is necessary. RELATED COMPLICATIONS   Recurrent symptoms that may result in a chronic condition.  Problems of the lower back that are caused by compensating for the injury, such as limping.  Pain or weakness of the foot during push-off following surgery.  Chronic inflammation, scarring, and partial or complete fascia tear, occurring more often from repeated injections. TREATMENT  Treatment initially involves the use of ice and medication to help reduce pain and inflammation. The use of strengthening and stretching exercises may help reduce pain with activity, especially stretches of the Achilles tendon. These exercises may be performed at home or with a therapist. Your caregiver may recommend that you use heel cups of arch supports to help reduce stress on the plantar fascia. Occasionally, corticosteroid injections are given to reduce inflammation. If symptoms persist for greater than 6 months despite non-surgical (conservative), then surgery may be recommended.  MEDICATION   If pain medication is necessary, then nonsteroidal anti-inflammatory medications, such as aspirin and ibuprofen, or other minor pain relievers, such as acetaminophen, are often recommended.  Do not take pain medication within 7 days before surgery.  Prescription pain relievers may be given if deemed necessary by your caregiver. Use only as directed and only as much  as you need.  Corticosteroid injections may be given by your caregiver. These injections should be reserved for the most serious cases, because they may  only be given a certain number of times. HEAT AND COLD  Cold treatment (icing) relieves pain and reduces inflammation. Cold treatment should be applied for 10 to 15 minutes every 2 to 3 hours for inflammation and pain and immediately after any activity that aggravates your symptoms. Use ice packs or massage the area with a piece of ice (ice massage).  Heat treatment may be used prior to performing the stretching and strengthening activities prescribed by your caregiver, physical therapist, or athletic trainer. Use a heat pack or soak the injury in warm water. SEEK IMMEDIATE MEDICAL CARE IF:  Treatment seems to offer no benefit, or the condition worsens.  Any medications produce adverse side effects. EXERCISES RANGE OF MOTION (ROM) AND STRETCHING EXERCISES - Plantar Fasciitis (Heel Spur Syndrome) These exercises may help you when beginning to rehabilitate your injury. Your symptoms may resolve with or without further involvement from your physician, physical therapist or athletic trainer. While completing these exercises, remember:   Restoring tissue flexibility helps normal motion to return to the joints. This allows healthier, less painful movement and activity.  An effective stretch should be held for at least 30 seconds.  A stretch should never be painful. You should only feel a gentle lengthening or release in the stretched tissue. RANGE OF MOTION - Toe Extension, Flexion  Sit with your right / left leg crossed over your opposite knee.  Grasp your toes and gently pull them back toward the top of your foot. You should feel a stretch on the bottom of your toes and/or foot.  Hold this stretch for __________ seconds.  Now, gently pull your toes toward the bottom of your foot. You should feel a stretch on the top of your toes and or foot.  Hold this stretch for __________ seconds. Repeat __________ times. Complete this stretch __________ times per day.  RANGE OF MOTION - Ankle  Dorsiflexion, Active Assisted  Remove shoes and sit on a chair that is preferably not on a carpeted surface.  Place right / left foot under knee. Extend your opposite leg for support.  Keeping your heel down, slide your right / left foot back toward the chair until you feel a stretch at your ankle or calf. If you do not feel a stretch, slide your bottom forward to the edge of the chair, while still keeping your heel down.  Hold this stretch for __________ seconds. Repeat __________ times. Complete this stretch __________ times per day.  STRETCH - Gastroc, Standing  Place hands on wall.  Extend right / left leg, keeping the front knee somewhat bent.  Slightly point your toes inward on your back foot.  Keeping your right / left heel on the floor and your knee straight, shift your weight toward the wall, not allowing your back to arch.  You should feel a gentle stretch in the right / left calf. Hold this position for __________ seconds. Repeat __________ times. Complete this stretch __________ times per day. STRETCH - S[place]us, Standing  Place hands on wall.  Extend right / left leg, keeping the other knee somewhat bent.  Slightly point your toes inward on your back foot.  Keep your right / left heel on the floor, bend your back knee, and slightly shift your weight over the back leg so that you feel a gentle stretch deep in your back  calf.  Hold this position for __________ seconds. Repeat __________ times. Complete this stretch __________ times per day. STRETCH - Gastrocsoleus, Standing  Note: This exercise can place a lot of stress on your foot and ankle. Please complete this exercise only if specifically instructed by your caregiver.   Place the ball of your right / left foot on a step, keeping your other foot firmly on the same step.  Hold on to the wall or a rail for balance.  Slowly lift your other foot, allowing your body weight to press your heel down over the edge of the  step.  You should feel a stretch in your right / left calf.  Hold this position for __________ seconds.  Repeat this exercise with a slight bend in your right / left knee. Repeat __________ times. Complete this stretch __________ times per day.  STRENGTHENING EXERCISES - Plantar Fasciitis (Heel Spur Syndrome)  These exercises may help you when beginning to rehabilitate your injury. They may resolve your symptoms with or without further involvement from your physician, physical therapist or athletic trainer. While completing these exercises, remember:   Muscles can gain both the endurance and the strength needed for everyday activities through controlled exercises.  Complete these exercises as instructed by your physician, physical therapist or athletic trainer. Progress the resistance and repetitions only as guided. STRENGTH - Towel Curls  Sit in a chair positioned on a non-carpeted surface.  Place your foot on a towel, keeping your heel on the floor.  Pull the towel toward your heel by only curling your toes. Keep your heel on the floor.  If instructed by your physician, physical therapist or athletic trainer, add ____________________ at the end of the towel. Repeat __________ times. Complete this exercise __________ times per day. STRENGTH - Ankle Inversion  Secure one end of a rubber exercise band/tubing to a fixed object (table, pole). Loop the other end around your foot just before your toes.  Place your fists between your knees. This will focus your strengthening at your ankle.  Slowly, pull your big toe up and in, making sure the band/tubing is positioned to resist the entire motion.  Hold this position for __________ seconds.  Have your muscles resist the band/tubing as it slowly pulls your foot back to the starting position. Repeat __________ times. Complete this exercises __________ times per day.    This information is not intended to replace advice given to you by  your health care provider. Make sure you discuss any questions you have with your health care provider.   Document Released: 10/07/2005 Document Revised: 02/21/2015 Document Reviewed: 01/19/2009 [employer] Interactive Patient Education [employer]

## 2016-05-01 NOTE — Progress Notes (Signed)
Subjective:    Patient ID: Stacy Hart, female    DOB: 1955/11/10, 60 y.o.   MRN: [id number]  05/01/2016  Foot Swelling (x 1 month, pain, gout, mostly left)   HPI This 60 y.o. female presents for evaluation of foot swelling. Heel started hurting and swelling and now improved.  Plantar fasciitis is acting up a lot in B feet L>R.  Ankle will still swell at times.  Attributed to gouty flare.  Had been taking 134m Allopurinol.  Then decided to increase to 3075mdaily; then started having nausea, diarrhea.  Had increased thyroid medication to 1008mto seven times per day.  Yet really thinks diarrhea due to allopurinol 300m26mily.  It took one week for diarrhea to improve.  Did take imodium.  Requesting Prednisone for plantar fasciitis.  Just got called for jury duty for six weeks.  Three blocks from parking garage to courthouse.  Talking to sister, stopped HCTZ due to gout frequency.   Taking 1/2 HCTZ and 1/2 in the morning.  Had to take Advil in middle in night for past two months.  Wakes up with a headache; skin has been really dry.  Worried about dehydration.  Dry mouth terrible.  Moments of dizziness.  Sugar borderline; having terrible muscle aches.  Hand pain and swelling.  Restarted .88 on Sunday.  Getting depressing dealing with "this crap"  No really it is "pissing me off".  Anxiety is terrible.  Worries about getting up to go to bathroom during court.  planned to take imodium before court.  Not taking Astelin.  Taking Benadryl PRN.  No previous hyoscyamine; makes mouth dry.    Father in law passed away last month; broke hip and was a long eight weeks.  Mourned that process.  "Make your bed"   Review of Systems  Constitutional: Negative for fever, chills, diaphoresis and fatigue.  HENT: Negative for congestion, ear pain, facial swelling, postnasal drip, rhinorrhea, sinus pressure, sore throat, trouble swallowing and voice change.   Eyes: Negative for visual disturbance.    Respiratory: Negative for cough and shortness of breath.   Cardiovascular: Positive for leg swelling. Negative for chest pain and palpitations.  Gastrointestinal: Positive for diarrhea and nausea. Negative for abdominal pain, constipation and vomiting.  Endocrine: Negative for cold intolerance, heat intolerance, polydipsia, polyphagia and polyuria.  Musculoskeletal: Positive for arthralgias and myalgias.  Neurological: Positive for dizziness and headaches. Negative for tremors, seizures, syncope, facial asymmetry, speech difficulty, weakness, light-headedness and numbness.  Psychiatric/Behavioral: Positive for sleep disturbance. The patient is nervous/anxious.     Past Medical History:  Diagnosis Date  . Acquired keratoderma   . Allergy   . Anxiety   . Arthritis   . Benign paroxysmal positional vertigo   . Chicken pox    childhood  . Cleft palate   . Esophageal reflux   . Gout, unspecified   . Measles    childhood  . Mixed hyperlipidemia   . Other specified conditions influencing health status(V49.89)   . Pain in limb   . Personal history of other diseases of circulatory system   . Symptomatic menopausal or female climacteric states   . Unspecified asthma(493.90)   . Unspecified essential hypertension   . Unspecified hypothyroidism   . Unspecified vitamin D deficiency   . Vitreous detachment of right eye 03/21/2014   [facility] Surgical History:  Procedure Laterality Date  . ABDOMINAL HYSTERECTOMY  2001   Ovaries intact, DUB, uterine fibroids  . ABDOMINAL  HYSTERECTOMY    . COSMETIC SURGERY    . FOOT SURGERY  2009   Left   Morton's Neuroma  . GALLBLADDER SURGERY  1998  . Heart ablation  1996   SVT  . surgeries for cleft palatte and lips     [facility]  . TUBAL LIGATION     Allergies  Allergen Reactions  . Epinephrine     "interferes with heart"  . Sulfa Antibiotics   . Ceftin [Cefuroxime Axetil] Rash    Social History   Social History  . Marital status:  Married    Spouse name: N/A  . Number of children: 2  . Years of education: 12   Occupational History  . Full time Parent    Social History Main Topics  . Smoking status: Former Smoker    Packs/day: 2.00    Years: 7.00    Types: Cigarettes  . Smokeless tobacco: Not on file     Comment: quit 28 yrs ago  . Alcohol use Yes     Comment: occasonal once a week  . Drug use: No  . Sexual activity: Yes   Other Topics Concern  . Not on file   Social History Narrative   Marital status:  Married x 30 years,happily, no domestic abuse.       Children: 2 daughters; no grandchildren.      Lives: with husband, adult daughter.      Employment: homemaker.      Tobacco:  never      Alcohol: social      Drugs:none       Guns:  No guns in the home. Smoke alarm in the home. Caffeine use: None. Well balanced diet.     Exercise history: Light, walking , 20 min., 2 - 3 times weekly. Organ donor: NO. Pt DOES have Living Will. DOES have HCPOA.   Family History  Problem Relation Age of Onset  . Heart disease Mother   . Stroke Mother   . Depression Mother   . Diabetes Mother   . Cancer Mother   . Heart disease Father   . Diabetes Sister   . Heart disease Sister   . Diabetes Brother   . Pulmonary embolism Brother   . Deep vein thrombosis Brother   . Stroke Brother     CVA  . Hypertension Sister   . Depression Sister   . Hyperlipidemia Sister   . Anxiety disorder Sister        Objective:    BP 120/78   Pulse 86   Temp 99 F (37.2 C)   Resp 16   Ht 5' 4.5" (1.638 m)   Wt 155 lb (70.3 kg)   SpO2 96%   BMI 26.19 kg/m  Physical Exam  Constitutional: She is oriented to person, place, and time. She appears well-developed and well-nourished. No distress.  HENT:  Head: Normocephalic and atraumatic.  Right Ear: External ear normal.  Left Ear: External ear normal.  Nose: Nose normal.  Mouth/Throat: Oropharynx is clear and moist.  Eyes: Conjunctivae and EOM are normal. Pupils are  equal, round, and reactive to light.  Neck: Normal range of motion. Neck supple. Carotid bruit is not present. No thyromegaly present.  Cardiovascular: Normal rate, regular rhythm, normal heart sounds and intact distal pulses.  Exam reveals no gallop and no friction rub.   No murmur heard. Pulmonary/Chest: Effort normal and breath sounds normal. She has no wheezes. She has no rales.  Abdominal: Soft. Bowel sounds  are normal. She exhibits no distension and no mass. There is no tenderness. There is no rebound and no guarding.  Lymphadenopathy:    She has no cervical adenopathy.  Neurological: She is alert and oriented to person, place, and time. No cranial nerve deficit.  Skin: Skin is warm and dry. No rash noted. She is not diaphoretic. No erythema. No pallor.  Psychiatric: She has a normal mood and affect. Her behavior is normal.   Results for orders placed or performed in visit on 03/05/16  CBC with Differential/Platelet  Result Value Ref Range   WBC 10.1 3.8 - 10.8 K/uL   RBC 5.02 3.80 - 5.10 MIL/uL   Hemoglobin 15.6 (H) 11.7 - 15.5 g/dL   HCT 45.6 (H) 35.0 - 45.0 %   MCV 90.8 80.0 - 100.0 fL   MCH 31.1 27.0 - 33.0 pg   MCHC 34.2 32.0 - 36.0 g/dL   RDW 13.0 11.0 - 15.0 %   Platelets 232 140 - 400 K/uL   MPV 10.9 7.5 - 12.5 fL   Neutro Abs 5,151 1,500 - 7,800 cells/uL   Lymphs Abs 3,838 850 - 3,900 cells/uL   Monocytes Absolute 606 200 - 950 cells/uL   Eosinophils Absolute 404 15 - 500 cells/uL   Basophils Absolute 101 0 - 200 cells/uL   Neutrophils Relative % 51 %   Lymphocytes Relative 38 %   Monocytes Relative 6 %   Eosinophils Relative 4 %   Basophils Relative 1 %   Smear Review Criteria for review not met   Comprehensive metabolic panel  Result Value Ref Range   Sodium 137 135 - 146 mmol/L   Potassium 3.9 3.5 - 5.3 mmol/L   Chloride 97 (L) 98 - 110 mmol/L   CO2 26 20 - 31 mmol/L   Glucose, Bld 100 (H) 65 - 99 mg/dL   BUN 12 7 - 25 mg/dL   Creat 0.60 0.50 - 0.99 mg/dL    Total Bilirubin 0.7 0.2 - 1.2 mg/dL   Alkaline Phosphatase 38 33 - 130 U/L   AST 22 10 - 35 U/L   ALT 18 6 - 29 U/L   Total Protein 7.6 6.1 - 8.1 g/dL   Albumin 4.2 3.6 - 5.1 g/dL   Calcium 9.3 8.6 - 10.4 mg/dL  Hemoglobin A1c  Result Value Ref Range   Hgb A1c MFr Bld 5.5 <5.7 %   Mean Plasma Glucose 111 mg/dL  Lipid panel  Result Value Ref Range   Cholesterol 170 125 - 200 mg/dL   Triglycerides 538 (H) <150 mg/dL   HDL 32 (L) >=46 mg/dL   Total CHOL/HDL Ratio 5.3 (H) <=5.0 Ratio   VLDL NOT CALC <30 mg/dL   LDL Cholesterol NOT CALC <130 mg/dL  T4, free  Result Value Ref Range   Free T4 1.1 0.8 - 1.8 ng/dL  TSH  Result Value Ref Range   TSH 10.60 (H) mIU/L  Hepatitis C antibody  Result Value Ref Range   HCV Ab NEGATIVE NEGATIVE  Uric Acid  Result Value Ref Range   Uric Acid, Serum 7.6 (H) 2.4 - 7.0 mg/dL  VITAMIN D 25 Hydroxy (Vit-D Deficiency, Fractures)  Result Value Ref Range   Vit D, 25-Hydroxy 20 (L) 30 - 100 ng/mL  POCT urinalysis dipstick  Result Value Ref Range   Color, UA yellow yellow   Clarity, UA clear clear   Glucose, UA negative negative   Bilirubin, UA negative negative   Ketones, POC UA negative negative  Spec Grav, UA 1.015    Blood, UA trace-intact (A) negative   pH, UA 5.5    Protein Ur, POC negative negative   Urobilinogen, UA 0.2    Nitrite, UA Negative Negative   Leukocytes, UA Negative Negative  POCT Microscopic Urinalysis ([facility])  Result Value Ref Range   WBC,UR,HPF,POC None None WBC/hpf   RBC,UR,HPF,POC None None RBC/hpf   Bacteria None None, Too numerous to count   Mucus Absent Absent   Epithelial Cells, UR Per Microscopy Moderate (A) None, Too numerous to count cells/hpf       Assessment & Plan:   1. Hypothyroidism due to acquired atrophy of thyroid   2. Mixed hyperlipidemia   3. Essential hypertension   4. SVT (supraventricular tachycardia) ([place])   5. Family history of celiac disease   6. Idiopathic chronic gout without  tophus, unspecified site   7. Acute stress reaction     -worsening gout; agreeable to switching diuretic to Lisinopril.   -rx for Synthroid provided. -rx for Prednisone provided for acute gouty attack.   -rx for hyoscyamine provided for IBS. -obtain labs.   No orders of the defined types were placed in this encounter.  Meds ordered this encounter  Medications  . DISCONTD: predniSONE (DELTASONE) 20 MG tablet    Sig: Three tablets daily x 2 days then two tablets daily x 5 days then one tablet daily x 5 days    Dispense:  21 tablet    Refill:  0  . lisinopril (PRINIVIL,ZESTRIL) 10 MG tablet    Sig: Take 1 tablet (10 mg total) by mouth daily.    Dispense:  90 tablet    Refill:  1  . levothyroxine (SYNTHROID) 88 MCG tablet    Sig: Take 1 tablet (88 mcg total) by mouth daily before breakfast.    Dispense:  30 tablet    Refill:  5  . hyoscyamine (LEVBID) 0.375 MG 12 hr tablet    Sig: Take 1 tablet (0.375 mg total) by mouth every 12 (twelve) hours as needed.    Dispense:  60 tablet    Refill:  3    No Follow-up on file.    Janequa Kipnis Elayne Guerin, M.D. [facility] [address] [place], [place]  [address] [phone] phone [phone] fax

## 2016-05-04 ENCOUNTER — Ambulatory Visit (INDEPENDENT_AMBULATORY_CARE_PROVIDER_SITE_OTHER): Payer: BLUE CROSS/BLUE SHIELD | Admitting: Family Medicine

## 2016-05-04 ENCOUNTER — Telehealth: Payer: Self-pay | Admitting: *Deleted

## 2016-05-04 VITALS — BP 142/90 | HR 79 | Temp 99.2°F | Resp 18 | Ht 64.5 in | Wt 156.2 lb

## 2016-05-04 DIAGNOSIS — E038 Other specified hypothyroidism: Secondary | ICD-10-CM

## 2016-05-04 DIAGNOSIS — K591 Functional diarrhea: Secondary | ICD-10-CM | POA: Diagnosis not present

## 2016-05-04 DIAGNOSIS — E034 Atrophy of thyroid (acquired): Secondary | ICD-10-CM | POA: Diagnosis not present

## 2016-05-04 DIAGNOSIS — J0101 Acute recurrent maxillary sinusitis: Secondary | ICD-10-CM | POA: Diagnosis not present

## 2016-05-04 DIAGNOSIS — K219 Gastro-esophageal reflux disease without esophagitis: Secondary | ICD-10-CM

## 2016-05-04 LAB — POCT CBC
GRANULOCYTE PERCENT: 54.9 % (ref 37–80)
HEMATOCRIT: 42.4 % (ref 37.7–47.9)
Hemoglobin: 14.8 g/dL (ref 12.2–16.2)
Lymph, poc: 5.3 — AB (ref 0.6–3.4)
MCH: 31.1 pg (ref 27–31.2)
MCHC: 35 g/dL (ref 31.8–35.4)
MCV: 88.8 fL (ref 80–97)
MID (cbc): 0.6 (ref 0–0.9)
MPV: 8.6 fL (ref 0–99.8)
POC GRANULOCYTE: 7.2 — AB (ref 2–6.9)
POC LYMPH %: 40.5 % (ref 10–50)
POC MID %: 4.6 %M (ref 0–12)
Platelet Count, POC: 192 10*3/uL (ref 142–424)
RBC: 4.78 M/uL (ref 4.04–5.48)
RDW, POC: 12.6 %
WBC: 13.1 10*3/uL — AB (ref 4.6–10.2)

## 2016-05-04 LAB — COMPREHENSIVE METABOLIC PANEL
ALK PHOS: 38 U/L (ref 33–130)
ALT: 18 U/L (ref 6–29)
AST: 19 U/L (ref 10–35)
Albumin: 4.4 g/dL (ref 3.6–5.1)
BILIRUBIN TOTAL: 0.6 mg/dL (ref 0.2–1.2)
BUN: 12 mg/dL (ref 7–25)
CALCIUM: 9.2 mg/dL (ref 8.6–10.4)
CO2: 24 mmol/L (ref 20–31)
CREATININE: 0.7 mg/dL (ref 0.50–0.99)
Chloride: 102 mmol/L (ref 98–110)
GLUCOSE: 76 mg/dL (ref 65–99)
Potassium: 3.8 mmol/L (ref 3.5–5.3)
Sodium: 139 mmol/L (ref 135–146)
Total Protein: 7.4 g/dL (ref 6.1–8.1)

## 2016-05-04 LAB — POC MICROSCOPIC URINALYSIS (UMFC)

## 2016-05-04 LAB — POCT URINALYSIS DIP (MANUAL ENTRY)
BILIRUBIN UA: NEGATIVE
Bilirubin, UA: NEGATIVE
Glucose, UA: NEGATIVE
Leukocytes, UA: NEGATIVE
Nitrite, UA: NEGATIVE
PROTEIN UA: NEGATIVE
SPEC GRAV UA: 1.02
Urobilinogen, UA: 0.2
pH, UA: 5

## 2016-05-04 LAB — TSH: TSH: 6.57 mIU/L — ABNORMAL HIGH

## 2016-05-04 LAB — T4, FREE: Free T4: 1.2 ng/dL (ref 0.8–1.8)

## 2016-05-04 MED ORDER — OMEPRAZOLE 20 MG PO CPDR: 20.0000 mg | DELAYED_RELEASE_CAPSULE | Freq: Every day | ORAL | Status: DC

## 2016-05-04 NOTE — Telephone Encounter (Signed)
Pt wanted a script for magic mouthwash with out the lidocaine [facility]

## 2016-05-04 NOTE — Progress Notes (Signed)
Subjective:    Patient ID: Stacy Hart, female    DOB: [birth year], 60 y.o.   MRN: [id number]  05/04/2016  Follow-up (f/u on BP & new medication. D/C med due to rapid heartbeat)   HPI This 60 y.o. female presents for 72 hour follow-up for hypertension.  Patient evaluated three days; Maxzide switched to Lisinopril to decrease frequency of gouty attack.  Also, pt requested Prednisone taper for plantar fasciitis.  Pt also switched from generic levothyroxine to Synthroid name brand. Also admitted to excessive anxiety yet declined pharmacotherapy for anxiety symptoms.  Took Lisinopril that morning after visit; had taken Cholecysteramine, Allopurinol 100mg  that day.  At 7:00, felt palpitations in neck.  When went to bed, having horrible palpitations.  The lisinopril has made patient feel like heart in knees.  Dr. Fayrene Fearing recommended Toprol because Metoprolol was allowing palpitations.  S/p ablation years ago for SVT; was not a complete success.  Does not feel well now; running low grade temperature.  Made fajitas for dinner; watched television with daughter last night.  All of sudden lats night, had horrible gas and had diarrhea again last night.  At 12:00am last night, took Imodium and 1/4 of alprazolam.  Got relaxed and slept in recliner.  Three weeks of intermittent diarrhea.  Worried about infection.  Worried about blood sugar dropping.  Started taking Prednisone this week; stopped Prednisone after one dose.  Returned to Maxzide last night.  Last night, felt like heart was racing a bit.  Have not eaten today; afraid to eat.  Started feeling badly three weeks ago; then ankle started acting up as well.  Has had several mosquito bites likely crazy.  No skin irritation.  Has suffered with a lot of congestion in nose; duration of nasal congestion for past two weeks.  No sinus pressure but feels really stuffy.  Had a terrible headache as well.    When started Metoprolol in past, felt horribly fatigued.   Dr. Fayrene Fearing started on Toprol XL; after two years, started having panic attacks. Also had pt on Levothyroxine daily.  Referred to Dr. Clarene Duke who gave patient another medication which made patient feel even worse.  Prescribed alprazolam because patient was having panic attacks.  Has always had alprazolam filled periodically in fear of recurrent panic attacks.  Then started on Maxzide which has always worked well.    Review of Systems  Constitutional: Positive for chills, diaphoresis, fatigue and fever.  HENT: Positive for congestion, postnasal drip, rhinorrhea, sinus pressure and voice change. Negative for facial swelling and sore throat.   Eyes: Negative for visual disturbance.  Respiratory: Negative for cough and shortness of breath.   Cardiovascular: Positive for palpitations. Negative for chest pain and leg swelling.  Gastrointestinal: Positive for abdominal distention and diarrhea. Negative for abdominal pain, constipation, nausea and vomiting.  Endocrine: Negative for cold intolerance, heat intolerance, polydipsia, polyphagia and polyuria.  Neurological: Negative for dizziness, tremors, seizures, syncope, facial asymmetry, speech difficulty, weakness, light-headedness, numbness and headaches.  Psychiatric/Behavioral: Positive for sleep disturbance. Negative for dysphoric mood, self-injury and suicidal ideas. The patient is nervous/anxious.     Past Medical History:  Diagnosis Date  . Acquired keratoderma   . Allergy   . Anxiety   . Arthritis   . Benign paroxysmal positional vertigo   . Chicken pox    childhood  . Cleft palate   . Esophageal reflux   . Gout, unspecified   . Measles    childhood  . Mixed hyperlipidemia   .  Other specified conditions influencing health status(V49.89)   . Pain in limb   . Personal history of other diseases of circulatory system   . Symptomatic menopausal or female climacteric states   . Unspecified asthma(493.90)   . Unspecified essential  hypertension   . Unspecified hypothyroidism   . Unspecified vitamin D deficiency   . Vitreous detachment of right eye 03/21/2014   [name]   Past Surgical History:  Procedure Laterality Date  . ABDOMINAL HYSTERECTOMY  2001   Ovaries intact, DUB, uterine fibroids  . ABDOMINAL HYSTERECTOMY    . COSMETIC SURGERY    . FOOT SURGERY  2009   Left   Morton's Neuroma  . GALLBLADDER SURGERY  1998  . Heart ablation  1996   SVT  . surgeries for cleft palatte and lips     [facility]  . TUBAL LIGATION     Allergies  Allergen Reactions  . Epinephrine     "interferes with heart"  . Sulfa Antibiotics   . Ceftin [Cefuroxime Axetil] Rash    Social History   Social History  . Marital status: Married    Spouse name: N/A  . Number of children: 2  . Years of education: 12   Occupational History  . Full time Parent    Social History Main Topics  . Smoking status: Former Smoker    Packs/day: 2.00    Years: 7.00    Types: Cigarettes  . Smokeless tobacco: Not on file     Comment: quit 28 yrs ago  . Alcohol use Yes     Comment: occasonal once a week  . Drug use: No  . Sexual activity: Yes   Other Topics Concern  . Not on file   Social History Narrative   Marital status:  Married x 30 years,happily, no domestic abuse.       Children: 2 daughters; no grandchildren.      Lives: with husband, adult daughter.      Employment: homemaker.      Tobacco:  never      Alcohol: social      Drugs:none       Guns:  No guns in the home. Smoke alarm in the home. Caffeine use: None. Well balanced diet.     Exercise history: Light, walking , 20 min., 2 - 3 times weekly. Organ donor: NO. Pt DOES have Living Will. DOES have HCPOA.   Family History  Problem Relation Age of Onset  . Heart disease Mother   . Stroke Mother   . Depression Mother   . Diabetes Mother   . Cancer Mother   . Heart disease Father   . Diabetes Sister   . Heart disease Sister   . Diabetes Brother   . Pulmonary embolism  Brother   . Deep vein thrombosis Brother   . Stroke Brother     CVA  . Hypertension Sister   . Depression Sister   . Hyperlipidemia Sister   . Anxiety disorder Sister        Objective:    BP (!) 142/90   Pulse 79   Temp 99.2 F (37.3 C) (Oral)   Resp 18   Ht 5' 4.5" (1.638 m)   Wt 156 lb 4 oz (70.9 kg)   SpO2 97%   BMI 26.41 kg/m  Physical Exam  Constitutional: She is oriented to person, place, and time. She appears well-developed and well-nourished. No distress.  HENT:  Head: Normocephalic and atraumatic.  Right Ear: External ear  normal.  Left Ear: External ear normal.  Nose: Right sinus exhibits maxillary sinus tenderness and frontal sinus tenderness. Left sinus exhibits maxillary sinus tenderness and frontal sinus tenderness.  Mouth/Throat: Oropharynx is clear and moist.  Eyes: Conjunctivae and EOM are normal. Pupils are equal, round, and reactive to light.  Neck: Normal range of motion. Neck supple. Carotid bruit is not present. No thyromegaly present.  Cardiovascular: Normal rate, regular rhythm, normal heart sounds and intact distal pulses.  Exam reveals no gallop and no friction rub.   No murmur heard. Pulmonary/Chest: Effort normal and breath sounds normal. She has no wheezes. She has no rales.  Abdominal: Soft. Bowel sounds are normal. She exhibits no distension and no mass. There is no tenderness. There is no rebound and no guarding.  Lymphadenopathy:    She has no cervical adenopathy.  Neurological: She is alert and oriented to person, place, and time. No cranial nerve deficit.  Skin: Skin is warm and dry. No rash noted. She is not diaphoretic. No erythema. No pallor.  Psychiatric: She has a normal mood and affect. Her behavior is normal.        Assessment & Plan:   1. Acute recurrent maxillary sinusitis   2. Functional diarrhea   3. Gastroesophageal reflux disease without esophagitis   4. Hypothyroidism due to acquired atrophy of thyroid     -new. -treat with Amoxicillin for sinusitis. -send stool studies due to diarrhea. -start Prilosec for GERD.  Obtain H. Pylori breath testing. -repeat labs. -RTC one month for reevaluation and sooner if worsens.   Orders Placed This Encounter  Procedures  . Stool culture  . Clostridium Difficile by PCR    Order Specific Question:   Is your patient experiencing loose or watery stools (3 or more in 24 hours)?    Answer:   Yes    Order Specific Question:   Has the patient received laxatives in the last 24 hours?    Answer:   No    Order Specific Question:   Has a negative Cdiff test resulted in the last 7 days?    Answer:   No  . Ova and parasite examination  . Comprehensive metabolic panel  . TSH  . T4, free  . H. pylori breath test  . POCT CBC  . POCT urinalysis dipstick  . POCT Microscopic Urinalysis ([facility])   Meds ordered this encounter  Medications  . omeprazole (PRILOSEC) 20 MG capsule    Sig: Take 1 capsule (20 mg total) by mouth daily.    Dispense:  30 capsule    Refill:  3    Return for recheck.    Aunna Snooks Paulita FujitaMartin Phelicia Dantes, M.D. [facility]  [place] [address] [place], [place]  [id number] [phone] phone [phone] fax

## 2016-05-04 NOTE — Patient Instructions (Signed)
     IF you received an x-ray today, you will receive an invoice from [facility]. Please contact [facility] at [phone] with questions or concerns regarding your invoice.   IF you received labwork today, you will receive an invoice from [facility]. Please contact [facility] at [phone] with questions or concerns regarding your invoice.   Our billing staff will not be able to assist you with questions regarding bills from these companies.  You will be contacted with the lab results as soon as they are available. The fastest way to get your results is to activate your My Chart account. Instructions are located on the last page of this paperwork. If you have not heard from us regarding the results in 2 weeks, please contact this office.      

## 2016-05-06 DIAGNOSIS — K591 Functional diarrhea: Secondary | ICD-10-CM | POA: Diagnosis not present

## 2016-05-06 LAB — H. PYLORI BREATH TEST: H. pylori Breath Test: NOT DETECTED

## 2016-05-06 MED ORDER — MAGIC MOUTHWASH
5.0000 mL | Freq: Four times a day (QID) | ORAL | Status: DC
Start: 2016-05-06 — End: 2016-05-31

## 2016-05-06 NOTE — Telephone Encounter (Signed)
Dr. Katrinka BlazingSmith   She states she has very dry mouth and she noticed that it has helped her in the past. Also she stated she does not need the lidocaine

## 2016-05-06 NOTE — Telephone Encounter (Signed)
Please call patient for clarification --- WHY does she need magic mouthwash?  Does she want magic mouthwash with or without lidocaine?

## 2016-05-06 NOTE — Telephone Encounter (Signed)
Please call in magic mouthwash.  Rx stated it would not be escribed?

## 2016-05-07 LAB — OVA AND PARASITE EXAMINATION: OP: NONE SEEN

## 2016-05-07 NOTE — Telephone Encounter (Signed)
Called rx for Magic Mouthwash to [facility] Called pt to advise rx has been called in.

## 2016-05-09 ENCOUNTER — Telehealth: Payer: Self-pay

## 2016-05-09 LAB — CLOSTRIDIUM DIFFICILE BY PCR

## 2016-05-09 NOTE — Telephone Encounter (Signed)
Pt called wanting her lab results. She was supposed to follow up with Dr. Katrinka BlazingSmith but didn't realize she would be out of town next week. Pt would like Dr. Katrinka BlazingSmith to call her before she goes out of town.

## 2016-05-10 ENCOUNTER — Telehealth: Payer: Self-pay

## 2016-05-10 LAB — STOOL CULTURE

## 2016-05-10 NOTE — Telephone Encounter (Signed)
Pt is also wanting dr Katrinka Blazingsmith to refer her to a gi specialist dr Christella Hartiganjacobs

## 2016-05-10 NOTE — Telephone Encounter (Signed)
Call --- 1.  Stool studies negative; no evidence of bacterial cause of diarrhea.  2.  Thyroid function much better with TSH of 6.0 on current dose of thyroid medication.  3. WBC count slightly elevated to confirm infection of some type.  4.  Urine is normal.  5. No evidence of H. Pylori infection in stomach.  6. Kidney and liver function tests are normal.  7.  How is patient feeling?  Recommend follow-up with me first week of August.

## 2016-05-13 ENCOUNTER — Encounter: Payer: Self-pay | Admitting: Gastroenterology

## 2016-05-13 NOTE — Telephone Encounter (Signed)
She will RTC in week.

## 2016-05-13 NOTE — Telephone Encounter (Signed)
Pt advised.

## 2016-05-13 NOTE — Telephone Encounter (Signed)
Ok to refer.

## 2016-05-31 ENCOUNTER — Ambulatory Visit (INDEPENDENT_AMBULATORY_CARE_PROVIDER_SITE_OTHER): Payer: BLUE CROSS/BLUE SHIELD | Admitting: Family Medicine

## 2016-05-31 VITALS — BP 132/90 | HR 80 | Temp 98.2°F | Resp 18 | Ht 64.5 in | Wt 152.2 lb

## 2016-05-31 DIAGNOSIS — E034 Atrophy of thyroid (acquired): Secondary | ICD-10-CM | POA: Diagnosis not present

## 2016-05-31 DIAGNOSIS — A09 Infectious gastroenteritis and colitis, unspecified: Secondary | ICD-10-CM | POA: Diagnosis not present

## 2016-05-31 DIAGNOSIS — I471 Supraventricular tachycardia: Secondary | ICD-10-CM | POA: Diagnosis not present

## 2016-05-31 DIAGNOSIS — K219 Gastro-esophageal reflux disease without esophagitis: Secondary | ICD-10-CM | POA: Diagnosis not present

## 2016-05-31 DIAGNOSIS — E038 Other specified hypothyroidism: Secondary | ICD-10-CM | POA: Diagnosis not present

## 2016-05-31 DIAGNOSIS — I10 Essential (primary) hypertension: Secondary | ICD-10-CM

## 2016-05-31 DIAGNOSIS — M1A00X0 Idiopathic chronic gout, unspecified site, without tophus (tophi): Secondary | ICD-10-CM | POA: Diagnosis not present

## 2016-05-31 NOTE — Progress Notes (Signed)
Patient ID: Stacy Hart, female   DOB: 12/14/1955, 60 y.o.   MRN: [id number]   Subjective:  By signing my name below, I, Stacy Hart, attest that this documentation has been prepared under the direction and in the presence of Nilda Simmer, MD. Electronically Signed: Stann Hart, Scribe. 05/31/2016, 4:22 PM .  Patient was seen in Room 12 .   Patient ID: Stacy Hart, female    DOB: 1956/06/29, 60 y.o.   MRN: [id number]  05/31/2016  Follow-up (Diagestive problems)   HPI Stacy Hart is a 60 y.o. female who presents to [facility] for follow up. Patient states she's generally feeling better. She's been taking her HCTZ. She mentions the synthroid is working better for her. She hasn't been taking her allopurinol but denies any recent gouty flare ups. She's been taking her prilosec; however, still having acid, but believes due to eating homegrown tomatoes lately. She's been taking her stool softener powder mixed with apple sauce. She's also taking her imodium every now and then, about once a week. She's been adjusting her diet for her reflux.   She's noticed some allergies this week with some drainage, but this is usual around this time of the year in her family. She denies any more coughing.   She also reports finding a small "mole" over her left flank. While taking a shower, she noticed some blood draining from the area. And, also saw a small tick over her right lower abdomen. She lit a Microbiologist and burned it off.   She is feeling much better from previous visit.  Diarrhea is much improved.  No longer having malaise and fatigue. Abdominal bloating is much better.    Review of Systems  Constitutional: Negative for chills, diaphoresis, fatigue, fever and unexpected weight change.  Eyes: Negative for visual disturbance.  Respiratory: Negative for cough and shortness of breath.   Cardiovascular: Negative for chest pain, palpitations and leg swelling.  Gastrointestinal: Negative  for abdominal pain, constipation, diarrhea, nausea and vomiting.  Endocrine: Negative for cold intolerance, heat intolerance, polydipsia, polyphagia and polyuria.  Skin: Negative for rash and wound.  Neurological: Negative for dizziness, tremors, seizures, syncope, facial asymmetry, speech difficulty, weakness, light-headedness, numbness and headaches.    Past Medical History:  Diagnosis Date  . Acquired keratoderma   . Allergy   . Anxiety   . Arthritis   . Benign paroxysmal positional vertigo   . Chicken pox    childhood  . Cleft palate   . Esophageal reflux   . Gout, unspecified   . Measles    childhood  . Mixed hyperlipidemia   . Other specified conditions influencing health status(V49.89)   . Pain in limb   . Personal history of other diseases of circulatory system   . Symptomatic menopausal or female climacteric states   . Unspecified asthma(493.90)   . Unspecified essential hypertension   . Unspecified hypothyroidism   . Unspecified vitamin D deficiency   . Vitreous detachment of right eye 03/21/2014   Stacy Hart   Past Surgical History:  Procedure Laterality Date  . ABDOMINAL HYSTERECTOMY  2001   Ovaries intact, DUB, uterine fibroids  . ABDOMINAL HYSTERECTOMY    . COSMETIC SURGERY    . FOOT SURGERY  2009   Left   Morton's Neuroma  . GALLBLADDER SURGERY  1998  . Heart ablation  1996   SVT  . surgeries for cleft palatte and lips     [facility]  . TUBAL LIGATION  Allergies  Allergen Reactions  . Epinephrine     "interferes with heart"  . Sulfa Antibiotics   . Ceftin [Cefuroxime Axetil] Rash    Social History   Social History  . Marital status: Married    Spouse name: N/A  . Number of children: 2  . Years of education: 12   Occupational History  . Full time Parent    Social History Main Topics  . Smoking status: Former Smoker    Packs/day: 2.00    Years: 7.00    Types: Cigarettes  . Smokeless tobacco: Not on file     Comment: quit 28 yrs ago  .  Alcohol use Yes     Comment: occasonal once a week  . Drug use: No  . Sexual activity: Yes   Other Topics Concern  . Not on file   Social History Narrative   Marital status:  Married x 30 years,happily, no domestic abuse.       Children: 2 daughters; no grandchildren.      Lives: with husband, adult daughter.      Employment: homemaker.      Tobacco:  never      Alcohol: social      Drugs:none       Guns:  No guns in the home. Smoke alarm in the home. Caffeine use: None. Well balanced diet.     Exercise history: Light, walking , 20 min., 2 - 3 times weekly. Organ donor: NO. Pt DOES have Living Will. DOES have HCPOA.   Family History  Problem Relation Age of Onset  . Heart disease Mother   . Stroke Mother   . Depression Mother   . Diabetes Mother   . Cancer Mother   . Heart disease Father   . Diabetes Sister   . Heart disease Sister   . Diabetes Brother   . Pulmonary embolism Brother   . Deep vein thrombosis Brother   . Stroke Brother     CVA  . Hypertension Sister   . Depression Sister   . Hyperlipidemia Sister   . Anxiety disorder Sister        Objective:    BP 132/90 (BP Location: Right Arm, Patient Position: Sitting, Cuff Size: Normal)   Pulse 80   Temp 98.2 F (36.8 C) (Oral)   Resp 18   Ht 5' 4.5" (1.638 m)   Wt 152 lb 3.2 oz (69 kg)   SpO2 96%   BMI 25.72 kg/m   Physical Exam  Constitutional: She is oriented to person, place, and time. She appears well-developed and well-nourished. No distress.  HENT:  Head: Normocephalic and atraumatic.  Right Ear: External ear normal.  Left Ear: External ear normal.  Nose: Nose normal.  Mouth/Throat: Oropharynx is clear and moist.  Eyes: Conjunctivae and EOM are normal. Pupils are equal, round, and reactive to light.  Neck: Normal range of motion. Neck supple. Carotid bruit is not present. No thyromegaly present.  Cardiovascular: Normal rate, regular rhythm, normal heart sounds and intact distal pulses.  Exam  reveals no gallop and no friction rub.   No murmur heard. Pulmonary/Chest: Effort normal and breath sounds normal. No respiratory distress. She has no wheezes. She has no rales.  Abdominal: Soft. Bowel sounds are normal. She exhibits no distension and no mass. There is no tenderness. There is no rebound and no guarding.  Musculoskeletal: Normal range of motion.  Lymphadenopathy:    She has no cervical adenopathy.  Neurological: She is alert  and oriented to person, place, and time. No cranial nerve deficit.  Skin: Skin is warm and dry. No rash noted. She is not diaphoretic. No erythema. No pallor.  Psychiatric: She has a normal mood and affect. Her behavior is normal.  Nursing note and vitals reviewed.       Assessment & Plan:   1. Essential hypertension   2. SVT (supraventricular tachycardia) (HCC)   3. Hypothyroidism due to acquired atrophy of thyroid   4. Idiopathic chronic gout without tophus, unspecified site   5. Gastroesophageal reflux disease without esophagitis   6. Diarrhea of infectious origin    -much improved. -reviewed labs in detail during visit.  -continue current medications.   No orders of the defined types were placed in this encounter.  No orders of the defined types were placed in this encounter.   No Follow-up on file.   I personally performed the services described in this documentation, which was scribed in my presence. The recorded information has been reviewed and considered.  Itha Kroeker Paulita FujitaMartin Ezechiel Stooksbury, M.D. [facility]  [place] [address] [place]  [id number] [phone] phone [phone] fax

## 2016-05-31 NOTE — Patient Instructions (Signed)
     IF you received an x-ray today, you will receive an invoice from [facility]. Please contact [facility] at [phone] with questions or concerns regarding your invoice.   IF you received labwork today, you will receive an invoice from [facility]. Please contact [facility] at [phone] with questions or concerns regarding your invoice.   Our billing staff will not be able to assist you with questions regarding bills from these companies.  You will be contacted with the lab results as soon as they are available. The fastest way to get your results is to activate your My Chart account. Instructions are located on the last page of this paperwork. If you have not heard from us regarding the results in 2 weeks, please contact this office.      

## 2016-07-22 ENCOUNTER — Encounter (INDEPENDENT_AMBULATORY_CARE_PROVIDER_SITE_OTHER): Payer: Self-pay

## 2016-07-22 ENCOUNTER — Encounter: Payer: Self-pay | Admitting: Gastroenterology

## 2016-07-22 ENCOUNTER — Ambulatory Visit (INDEPENDENT_AMBULATORY_CARE_PROVIDER_SITE_OTHER): Payer: BLUE CROSS/BLUE SHIELD | Admitting: Gastroenterology

## 2016-07-22 VITALS — BP 140/90 | HR 72 | Ht 64.0 in | Wt 150.1 lb

## 2016-07-22 DIAGNOSIS — R1084 Generalized abdominal pain: Secondary | ICD-10-CM

## 2016-07-22 DIAGNOSIS — R195 Other fecal abnormalities: Secondary | ICD-10-CM | POA: Diagnosis not present

## 2016-07-22 MED ORDER — NA SULFATE-K SULFATE-MG SULF 17.5-3.13-1.6 GM/177ML PO SOLN: 1.0000 | Freq: Once | ORAL | 0 refills | Status: AC

## 2016-07-22 NOTE — Progress Notes (Signed)
HPI: This is a pleasant 60 year old woman who was referred to me by Ethelda Chick, MD  to evaluate  chronic loose stools, intermittent abdominal discomforts .    Chief complaint is chronic loose stools, intermittent abdominal discomforts  Daughter has celiac.  She has always had issues with her bowels.  Relates it to around the time she started her thyroid meds.  Has been taking [place] for a long time, for many years.  On this regimen her bowels are much improved; more bulk to her stools and less frequent.  She stopped this due to family concerns, events.  Loose stools returned and so she resumed the [place].  Started taking a lot of advil for plantar fascitis; this past summer.  Started taking PPI and that seemed to help after about a week.  Her stool has bulk; will have usually two BMs in the morning.  Never bloody.  Very scattered history provider.  Discussed her xanax.  Is taking questran twice daily. And on this she has soft formed BM twice daily.  Celiac panel negative 2015.  Burning in epigastrium for years, lately she's been taking PPI.  Overall her weight down 6-8, after watching her diet.  She's never had a colonoscopy.    Sister had colon cancer, died 1-2 years ago.  She was in her 45s.  Also an intermittent sharp pain in her right side  Also bad gas  Also bad breath.  She has a 'weird heart'  With SVT s/p ablation.  Lab testing July 2017: Stool for C. difficile by PCR was negative, ova parasites was negative, routine stool culture was negative. TSH was slightly elevated, complete about profile was normal, H. pylori by breath testing was negative.   Review of systems: Pertinent positive and negative review of systems were noted in the above HPI section. Complete review of systems was performed and was otherwise normal.   Past Medical History:  Diagnosis Date  . Acquired keratoderma   . Allergy   . Anxiety   . Arthritis   . Benign paroxysmal  positional vertigo   . Chicken pox    childhood  . Cleft palate   . Esophageal reflux   . Gallstones   . GERD (gastroesophageal reflux disease)   . Gout, unspecified   . Measles    childhood  . Mixed hyperlipidemia   . Other specified conditions influencing health status(V49.89)   . Pain in limb   . Personal history of other diseases of circulatory system   . SVT (supraventricular tachycardia) (HCC)   . Symptomatic menopausal or female climacteric states   . Unspecified asthma(493.90)   . Unspecified essential hypertension   . Unspecified hypothyroidism   . Unspecified vitamin D deficiency   . Vitreous detachment of right eye 03/21/2014   [name]    Past Surgical History:  Procedure Laterality Date  . ABDOMINAL HYSTERECTOMY  2001   Ovaries intact, DUB, uterine fibroids  . COSMETIC SURGERY    . FOOT SURGERY  2009   Left   Morton's Neuroma  . GALLBLADDER SURGERY  1998  . Heart ablation  1996   SVT  . OOPHORECTOMY    . surgeries for cleft palatte and lips     [facility]  . TUBAL LIGATION      Current Outpatient Prescriptions  Medication Sig Dispense Refill  . ALPRAZolam (XANAX) 0.5 MG tablet Take 1 tablet (0.5 mg total) by mouth at bedtime as needed for anxiety. 30 tablet 0  . cholestyramine (  QUESTRAN) 4 g packet USE 1 PACKET TWICE A DAY AS DIRECTED 180 packet 3  . estradiol (ESTRACE) 2 MG tablet Take 2 mg by mouth daily.  5  . levothyroxine (SYNTHROID) 88 MCG tablet Take 1 tablet (88 mcg total) by mouth daily before breakfast. 30 tablet 5  . omeprazole (PRILOSEC) 20 MG capsule Take 1 capsule (20 mg total) by mouth daily. 30 capsule 3  . triamterene-hydrochlorothiazide (MAXZIDE-25) 37.5-25 MG tablet TAKE ONE TABLET BY MOUTH ONE TIME DAILY 90 tablet 3   No current facility-administered medications for this visit.     Allergies as of 07/22/2016 - Review Complete 07/22/2016  Allergen Reaction Noted  . Epinephrine  09/03/2012  . Sulfa antibiotics  09/03/2012  . Ceftin  [cefuroxime axetil] Rash 09/03/2012    Family History  Problem Relation Age of Onset  . Heart disease Mother   . Stroke Mother   . Depression Mother   . Diabetes Mother   . Cancer Mother   . Heart disease Father   . Diabetes Sister   . Heart disease Sister   . Diabetes Brother   . Pulmonary embolism Brother   . Deep vein thrombosis Brother   . Stroke Brother     CVA  . Hypertension Sister   . Depression Sister   . Hyperlipidemia Sister   . Anxiety disorder Sister     Social History   Social History  . Marital status: Married    Spouse name: N/A  . Number of children: 2  . Years of education: 12   Occupational History  . Full time Parent    Social History Main Topics  . Smoking status: Former Smoker    Packs/day: 2.00    Years: 7.00    Types: Cigarettes  . Smokeless tobacco: Not on file     Comment: quit 28 yrs ago  . Alcohol use Yes     Comment: occasonal once a week  . Drug use: No  . Sexual activity: Yes   Other Topics Concern  . Not on file   Social History Narrative   Marital status:  Married x 30 years,happily, no domestic abuse.       Children: 2 daughters; no grandchildren.      Lives: with husband, adult daughter.      Employment: homemaker.      Tobacco:  never      Alcohol: social      Drugs:none       Guns:  No guns in the home. Smoke alarm in the home. Caffeine use: None. Well balanced diet.     Exercise history: Light, walking , 20 min., 2 - 3 times weekly. Organ donor: NO. Pt DOES have Living Will. DOES have HCPOA.     Physical Exam: Ht 5\' 4"  (1.626 m) Comment: height measured without shoes  Wt 150 lb 2 oz (68.1 kg)   BMI 25.77 kg/m  Constitutional: generally well-appearing Psychiatric: alert and oriented x3 Eyes: extraocular movements intact Mouth: oral pharynx moist, no lesions Neck: supple no lymphadenopathy Cardiovascular: heart regular rate and rhythm Lungs: clear to auscultation bilaterally Abdomen: soft, nontender,  nondistended, no obvious ascites, no peritoneal signs, normal bowel sounds Extremities: no lower extremity edema bilaterally Skin: no lesions on visible extremities   Assessment and plan: 60 y.o. female with  Chronic loose stools, intermittent abdominal discomforts  I do think that is very possible she has bile acid related loose stools. Questran has helped and I recommended she continue that 1-2  doses daily. Adding a single Imodium may help a bit as well and she will try that on a scheduled morning basis. She has never had colonoscopy for colon cancer screening or to rule out other causes of her chronic loose stools and I recommended we proceed with that as well. Also her sister had colon cancer in her early 4s and so she is probably at somewhat increased risk for colon cancer herself.   Rob Bunting, MD [facility] 07/22/2016, 11:24 AM  Cc: Ethelda Chick, MD

## 2016-07-22 NOTE — Patient Instructions (Signed)
You will be set up for a colonoscopy for chronic loose stools, intermittent abd pains. Start taking imodium one pill every morning for now in addition to your cholestyramine.

## 2016-08-13 ENCOUNTER — Encounter: Payer: Self-pay | Admitting: Gastroenterology

## 2016-08-20 ENCOUNTER — Telehealth: Payer: Self-pay | Admitting: Gastroenterology

## 2016-08-20 ENCOUNTER — Encounter: Payer: BLUE CROSS/BLUE SHIELD | Admitting: Gastroenterology

## 2016-08-20 NOTE — Telephone Encounter (Signed)
Returned patient's call and she states  that she threw up yesterday evening and this morning.  She had called and spoke with Dr. Christella HartiganJacobs yesterday evening and he called in Reglan for her and she is still throwing up still this morning.  She states that she cannot finish drinking the prep and she wants to come in and see Dr. Christella HartiganJacobs in the office.  She said that Imodium works for her diarrhea. I transferred the call to third floor to schedule office visit.  I will transfer this note to Dr. Christella HartiganJacobs for advise on charging the patient.

## 2016-08-20 NOTE — Telephone Encounter (Signed)
No charge.  I agree with rov    Thanks

## 2016-09-03 ENCOUNTER — Ambulatory Visit (INDEPENDENT_AMBULATORY_CARE_PROVIDER_SITE_OTHER): Payer: BLUE CROSS/BLUE SHIELD | Admitting: Family Medicine

## 2016-09-03 ENCOUNTER — Encounter: Payer: Self-pay | Admitting: Family Medicine

## 2016-09-03 VITALS — BP 128/90 | HR 74 | Temp 98.2°F | Resp 18 | Ht 64.0 in | Wt 149.4 lb

## 2016-09-03 DIAGNOSIS — E782 Mixed hyperlipidemia: Secondary | ICD-10-CM | POA: Diagnosis not present

## 2016-09-03 DIAGNOSIS — I471 Supraventricular tachycardia: Secondary | ICD-10-CM

## 2016-09-03 DIAGNOSIS — M1A00X0 Idiopathic chronic gout, unspecified site, without tophus (tophi): Secondary | ICD-10-CM | POA: Diagnosis not present

## 2016-09-03 DIAGNOSIS — E034 Atrophy of thyroid (acquired): Secondary | ICD-10-CM | POA: Diagnosis not present

## 2016-09-03 DIAGNOSIS — Z8379 Family history of other diseases of the digestive system: Secondary | ICD-10-CM | POA: Diagnosis not present

## 2016-09-03 DIAGNOSIS — I10 Essential (primary) hypertension: Secondary | ICD-10-CM

## 2016-09-03 DIAGNOSIS — Z800 Family history of malignant neoplasm of digestive organs: Secondary | ICD-10-CM

## 2016-09-03 LAB — LIPID PANEL
CHOL/HDL RATIO: 6.4 ratio — AB (ref <5.0)
CHOLESTEROL: 173 mg/dL (ref <200)
HDL: 27 mg/dL — AB (ref >50)
TRIGLYCERIDES: 710 mg/dL — AB (ref <150)

## 2016-09-03 LAB — CBC WITH DIFFERENTIAL/PLATELET
BASOS ABS: 115 {cells}/uL (ref 0–200)
BASOS PCT: 1 %
EOS ABS: 345 {cells}/uL (ref 15–500)
EOS PCT: 3 %
HCT: 45.2 % — ABNORMAL HIGH (ref 35.0–45.0)
HEMOGLOBIN: 15.2 g/dL (ref 11.7–15.5)
LYMPHS ABS: 4715 {cells}/uL — AB (ref 850–3900)
Lymphocytes Relative: 41 %
MCH: 31.3 pg (ref 27.0–33.0)
MCHC: 33.6 g/dL (ref 32.0–36.0)
MCV: 93.2 fL (ref 80.0–100.0)
MPV: 11 fL (ref 7.5–12.5)
Monocytes Absolute: 690 cells/uL (ref 200–950)
Monocytes Relative: 6 %
NEUTROS ABS: 5635 {cells}/uL (ref 1500–7800)
Neutrophils Relative %: 49 %
PLATELETS: 258 10*3/uL (ref 140–400)
RBC: 4.85 MIL/uL (ref 3.80–5.10)
RDW: 12.9 % (ref 11.0–15.0)
WBC: 11.5 10*3/uL — ABNORMAL HIGH (ref 3.8–10.8)

## 2016-09-03 LAB — COMPREHENSIVE METABOLIC PANEL
ALBUMIN: 4.3 g/dL (ref 3.6–5.1)
ALK PHOS: 43 U/L (ref 33–130)
ALT: 21 U/L (ref 6–29)
AST: 26 U/L (ref 10–35)
BUN: 13 mg/dL (ref 7–25)
CALCIUM: 9.5 mg/dL (ref 8.6–10.4)
CHLORIDE: 98 mmol/L (ref 98–110)
CO2: 28 mmol/L (ref 20–31)
Creat: 0.61 mg/dL (ref 0.50–0.99)
Glucose, Bld: 73 mg/dL (ref 65–99)
POTASSIUM: 4.3 mmol/L (ref 3.5–5.3)
Sodium: 138 mmol/L (ref 135–146)
TOTAL PROTEIN: 7.5 g/dL (ref 6.1–8.1)
Total Bilirubin: 0.5 mg/dL (ref 0.2–1.2)

## 2016-09-03 LAB — T4, FREE: Free T4: 1.1 ng/dL (ref 0.8–1.8)

## 2016-09-03 LAB — TSH: TSH: 23.22 mIU/L — ABNORMAL HIGH

## 2016-09-03 MED ORDER — TRIAMTERENE-HCTZ 37.5-25 MG PO TABS: ORAL_TABLET | ORAL | 3 refills | Status: DC

## 2016-09-03 MED ORDER — ALPRAZOLAM 0.5 MG PO TABS: 0.5000 mg | ORAL_TABLET | Freq: Every evening | ORAL | 0 refills | Status: DC | PRN

## 2016-09-03 MED ORDER — LEVOTHYROXINE SODIUM 88 MCG PO TABS: 88.0000 ug | ORAL_TABLET | Freq: Every day | ORAL | 3 refills | Status: DC

## 2016-09-03 MED ORDER — CHOLESTYRAMINE 4 G PO PACK: PACK | ORAL | 3 refills | Status: DC

## 2016-09-03 MED ORDER — ALLOPURINOL 100 MG PO TABS: 100.0000 mg | ORAL_TABLET | Freq: Every day | ORAL | 3 refills | Status: DC

## 2016-09-03 NOTE — Progress Notes (Signed)
Subjective:    Patient ID: Stacy KennerSusan Murphy Hart, female    DOB: Mar 13, 1956, 60 y.o.   MRN: [id number]  09/03/2016  Follow-up (6 month follow up)   HPI This 60 y.o. female presents for six month follow-up of hypertension, hypertriglyceridemia, hypothyroidism, SVT.  S/p consultation with Dr. Christella HartiganJacobs; recommended Lomotil one every morning.  Took prep for colonoscopy but became very nauseated. Recommended Miralax and gatorade.  Vomited six times.  Christella HartiganJacobs called in medication for nausea.  Unable to undergo colonoscopy.  Recommended Cologuard.  Must wait until January to follow-up.    L wrist knot; got really inflamed and swollen; occurred after prep for colonoscopy.  Friend diagnosed Bible cyst aka ganglion cyst.  Bought a brace on wrist years ago.  Just flared two weeks ago.  Worsening in past two days.   Wt Readings from Last 3 Encounters:  09/03/16 149 lb 6.4 oz (67.8 kg)  07/22/16 150 lb 2 oz (68.1 kg)  05/31/16 152 lb 3.2 oz (69 kg)   BP Readings from Last 3 Encounters:  09/03/16 128/90  07/22/16 140/90  05/31/16 132/90   Insomnia: onset after birth of first child.  Takes Alprazolam 1/4 qhs.    Review of Systems  Constitutional: Negative for chills, diaphoresis, fatigue and fever.  Eyes: Negative for visual disturbance.  Respiratory: Negative for cough and shortness of breath.   Cardiovascular: Negative for chest pain, palpitations and leg swelling.  Gastrointestinal: Negative for abdominal pain, constipation, diarrhea, nausea and vomiting.  Endocrine: Negative for cold intolerance, heat intolerance, polydipsia, polyphagia and polyuria.  Neurological: Negative for dizziness, tremors, seizures, syncope, facial asymmetry, speech difficulty, weakness, light-headedness, numbness and headaches.  Psychiatric/Behavioral: Positive for sleep disturbance. Negative for self-injury and suicidal ideas. The patient is nervous/anxious.     Past Medical History:  Diagnosis Date  . Acquired  keratoderma   . Allergy   . Anxiety   . Arthritis   . Benign paroxysmal positional vertigo   . Chicken pox    childhood  . Cleft palate   . Esophageal reflux   . Gallstones   . GERD (gastroesophageal reflux disease)   . Gout, unspecified   . Measles    childhood  . Mixed hyperlipidemia   . Other specified conditions influencing health status(V49.89)   . Pain in limb   . Personal history of other diseases of circulatory system   . SVT (supraventricular tachycardia) (HCC)   . Symptomatic menopausal or female climacteric states   . Unspecified asthma(493.90)   . Unspecified essential hypertension   . Unspecified hypothyroidism   . Unspecified vitamin D deficiency   . Vitreous detachment of right eye 03/21/2014   Allyne GeeSanders   Past Surgical History:  Procedure Laterality Date  . ABDOMINAL HYSTERECTOMY  2001   Ovaries intact, DUB, uterine fibroids  . COSMETIC SURGERY    . FOOT SURGERY  2009   Left   Morton's Neuroma  . GALLBLADDER SURGERY  1998  . Heart ablation  1996   SVT  . OOPHORECTOMY    . surgeries for cleft palatte and lips     [facility]  . TUBAL LIGATION     Allergies  Allergen Reactions  . Epinephrine     "interferes with heart"  . Sulfa Antibiotics   . Ceftin [Cefuroxime Axetil] Rash   Current Outpatient Prescriptions  Medication Sig Dispense Refill  . ALPRAZolam (XANAX) 0.5 MG tablet Take 1 tablet (0.5 mg total) by mouth at bedtime as needed for anxiety. 30 tablet 0  .  cholestyramine (QUESTRAN) 4 g packet USE 1 PACKET TWICE A DAY AS DIRECTED 180 packet 3  . estradiol (ESTRACE) 2 MG tablet Take 2 mg by mouth daily.  5  . levothyroxine (SYNTHROID) 88 MCG tablet Take 1 tablet (88 mcg total) by mouth daily before breakfast. BRAND NAME SYNTHROID. 90 tablet 3  . loperamide (IMODIUM A-D) 2 MG tablet Take 2 mg by mouth daily.    Marland Kitchen omeprazole (PRILOSEC) 20 MG capsule Take 1 capsule (20 mg total) by mouth daily. 30 capsule 3  . triamterene-hydrochlorothiazide (MAXZIDE-25)  37.5-25 MG tablet TAKE ONE TABLET BY MOUTH ONE TIME DAILY 90 tablet 3  . allopurinol (ZYLOPRIM) 100 MG tablet Take 1 tablet (100 mg total) by mouth daily. 90 tablet 3   No current facility-administered medications for this visit.    Social History   Social History  . Marital status: Married    Spouse name: N/A  . Number of children: 2  . Years of education: 12   Occupational History  . Full time Parent    Social History Main Topics  . Smoking status: Former Smoker    Packs/day: 2.00    Years: 7.00    Types: Cigarettes    Quit date: 07/23/1983  . Smokeless tobacco: Never Used  . Alcohol use Yes     Comment: occasonal once a week  . Drug use: No  . Sexual activity: Yes   Other Topics Concern  . Not on file   Social History Narrative   Marital status:  Married x 30 years,happily, no domestic abuse.       Children: 2 daughters; no grandchildren.      Lives: with husband, adult daughter.      Employment: homemaker.      Tobacco:  never      Alcohol: social      Drugs:none       Guns:  No guns in the home. Smoke alarm in the home. Caffeine use: None. Well balanced diet.     Exercise history: Light, walking , 20 min., 2 - 3 times weekly. Organ donor: NO. Pt DOES have Living Will. DOES have HCPOA.   Family History  Problem Relation Age of Onset  . Heart disease Mother   . Stroke Mother   . Depression Mother   . Diabetes Mother   . Cancer Mother     tumor behind eye  . Heart disease Father   . Diabetes Sister   . Heart disease Sister   . Diabetes Brother   . Pulmonary embolism Brother   . Deep vein thrombosis Brother   . Stroke Brother     CVA  . Colon cancer Brother   . Hypertension Sister   . Depression Sister   . Hyperlipidemia Sister   . Anxiety disorder Sister   . Colon cancer Sister     mets  . Celiac disease Daughter        Objective:    BP 128/90   Pulse 74   Temp 98.2 F (36.8 C) (Oral)   Resp 18   Ht 5\' 4"  (1.626 m)   Wt 149 lb 6.4 oz (67.8  kg)   SpO2 98%   BMI 25.64 kg/m  Physical Exam  Constitutional: She is oriented to person, place, and time. She appears well-developed and well-nourished. No distress.  HENT:  Head: Normocephalic and atraumatic.  Right Ear: External ear normal.  Left Ear: External ear normal.  Nose: Nose normal.  Mouth/Throat: Oropharynx is clear  and moist.  Eyes: Conjunctivae and EOM are normal. Pupils are equal, round, and reactive to light.  Neck: Normal range of motion. Neck supple. Carotid bruit is not present. No thyromegaly present.  Cardiovascular: Normal rate, regular rhythm, normal heart sounds and intact distal pulses.  Exam reveals no gallop and no friction rub.   No murmur heard. Pulmonary/Chest: Effort normal and breath sounds normal. She has no wheezes. She has no rales.  Abdominal: Soft. Bowel sounds are normal. She exhibits no distension and no mass. There is no tenderness. There is no rebound and no guarding.  Lymphadenopathy:    She has no cervical adenopathy.  Neurological: She is alert and oriented to person, place, and time. No cranial nerve deficit.  Skin: Skin is warm and dry. No rash noted. She is not diaphoretic. No erythema. No pallor.  Psychiatric: She has a normal mood and affect. Her behavior is normal.        Assessment & Plan:   1. Essential hypertension   2. SVT (supraventricular tachycardia) (HCC)   3. Hypothyroidism due to acquired atrophy of thyroid   4. Mixed hyperlipidemia   5. Idiopathic chronic gout without tophus, unspecified site   6. Family history of celiac disease    -controlled; obtain labs; continue current medications. -will warrant re-attempt at colonoscopy in upcoming months due to family history of colon cancer in sister.   Orders Placed This Encounter  Procedures  . CBC with Differential/Platelet  . Comprehensive metabolic panel    Order Specific Question:   Has the patient fasted?    Answer:   Yes  . Lipid panel    Order Specific  Question:   Has the patient fasted?    Answer:   Yes  . TSH  . T4, free   Meds ordered this encounter  Medications  . loperamide (IMODIUM A-D) 2 MG tablet    Sig: Take 2 mg by mouth daily.  Marland Kitchen. DISCONTD: allopurinol (ZYLOPRIM) 100 MG tablet    Sig: Take 100 mg by mouth daily.  Marland Kitchen. allopurinol (ZYLOPRIM) 100 MG tablet    Sig: Take 1 tablet (100 mg total) by mouth daily.    Dispense:  90 tablet    Refill:  3  . levothyroxine (SYNTHROID) 88 MCG tablet    Sig: Take 1 tablet (88 mcg total) by mouth daily before breakfast. BRAND NAME SYNTHROID.    Dispense:  90 tablet    Refill:  3  . triamterene-hydrochlorothiazide (MAXZIDE-25) 37.5-25 MG tablet    Sig: TAKE ONE TABLET BY MOUTH ONE TIME DAILY    Dispense:  90 tablet    Refill:  3  . cholestyramine (QUESTRAN) 4 g packet    Sig: USE 1 PACKET TWICE A DAY AS DIRECTED    Dispense:  180 packet    Refill:  3  . ALPRAZolam (XANAX) 0.5 MG tablet    Sig: Take 1 tablet (0.5 mg total) by mouth at bedtime as needed for anxiety.    Dispense:  30 tablet    Refill:  0    Return in about 6 months (around 03/03/2017) for complete physical examiniation.   Mescal Flinchbaugh Paulita FujitaMartin Ewald Beg, M.D. [facility]  [place] [address] [place], [place]  [id number] [phone] phone [phone] fax

## 2016-09-03 NOTE — Patient Instructions (Signed)
     IF you received an x-ray today, you will receive an invoice from [facility]. Please contact [facility] at [phone] with questions or concerns regarding your invoice.   IF you received labwork today, you will receive an invoice from [facility]. Please contact [facility] at [phone] with questions or concerns regarding your invoice.   Our billing staff will not be able to assist you with questions regarding bills from these companies.  You will be contacted with the lab results as soon as they are available. The fastest way to get your results is to activate your My Chart account. Instructions are located on the last page of this paperwork. If you have not heard from us regarding the results in 2 weeks, please contact this office.      

## 2016-09-06 ENCOUNTER — Encounter: Payer: BLUE CROSS/BLUE SHIELD | Admitting: Gastroenterology

## 2016-09-16 DIAGNOSIS — Z800 Family history of malignant neoplasm of digestive organs: Secondary | ICD-10-CM | POA: Insufficient documentation

## 2016-09-17 DIAGNOSIS — H35372 Puckering of macula, left eye: Secondary | ICD-10-CM | POA: Diagnosis not present

## 2016-11-08 ENCOUNTER — Ambulatory Visit: Payer: BLUE CROSS/BLUE SHIELD | Admitting: Gastroenterology

## 2017-01-09 DIAGNOSIS — L309 Dermatitis, unspecified: Secondary | ICD-10-CM | POA: Diagnosis not present

## 2017-01-10 ENCOUNTER — Encounter: Payer: Self-pay | Admitting: Family Medicine

## 2017-01-21 DIAGNOSIS — Z6825 Body mass index (BMI) 25.0-25.9, adult: Secondary | ICD-10-CM | POA: Diagnosis not present

## 2017-01-21 DIAGNOSIS — Z1231 Encounter for screening mammogram for malignant neoplasm of breast: Secondary | ICD-10-CM | POA: Diagnosis not present

## 2017-01-21 DIAGNOSIS — Z01419 Encounter for gynecological examination (general) (routine) without abnormal findings: Secondary | ICD-10-CM | POA: Diagnosis not present

## 2017-01-22 ENCOUNTER — Other Ambulatory Visit: Payer: Self-pay | Admitting: Obstetrics & Gynecology

## 2017-01-22 DIAGNOSIS — R928 Other abnormal and inconclusive findings on diagnostic imaging of breast: Secondary | ICD-10-CM

## 2017-01-27 ENCOUNTER — Other Ambulatory Visit: Payer: Self-pay | Admitting: Obstetrics & Gynecology

## 2017-01-27 ENCOUNTER — Ambulatory Visit
Admission: RE | Admit: 2017-01-27 | Discharge: 2017-01-27 | Disposition: A | Discharge status: Home / Self Care | Payer: BLUE CROSS/BLUE SHIELD | Source: Ambulatory Visit | Attending: Obstetrics & Gynecology | Admitting: Obstetrics & Gynecology

## 2017-01-27 DIAGNOSIS — N6312 Unspecified lump in the right breast, upper inner quadrant: Secondary | ICD-10-CM | POA: Diagnosis not present

## 2017-01-27 DIAGNOSIS — R928 Other abnormal and inconclusive findings on diagnostic imaging of breast: Secondary | ICD-10-CM

## 2017-01-27 DIAGNOSIS — N6311 Unspecified lump in the right breast, upper outer quadrant: Secondary | ICD-10-CM | POA: Diagnosis not present

## 2017-01-27 DIAGNOSIS — N6310 Unspecified lump in the right breast, unspecified quadrant: Secondary | ICD-10-CM

## 2017-01-29 ENCOUNTER — Ambulatory Visit
Admission: RE | Admit: 2017-01-29 | Discharge: 2017-01-29 | Disposition: A | Discharge status: Home / Self Care | Payer: BLUE CROSS/BLUE SHIELD | Source: Ambulatory Visit | Attending: Obstetrics & Gynecology | Admitting: Obstetrics & Gynecology

## 2017-01-29 ENCOUNTER — Other Ambulatory Visit: Payer: Self-pay | Admitting: Obstetrics & Gynecology

## 2017-01-29 DIAGNOSIS — N6312 Unspecified lump in the right breast, upper inner quadrant: Secondary | ICD-10-CM | POA: Diagnosis not present

## 2017-01-29 DIAGNOSIS — N6310 Unspecified lump in the right breast, unspecified quadrant: Secondary | ICD-10-CM

## 2017-01-29 DIAGNOSIS — N6311 Unspecified lump in the right breast, upper outer quadrant: Secondary | ICD-10-CM | POA: Diagnosis not present

## 2017-01-29 HISTORY — PX: BREAST BIOPSY: SHX20

## 2017-03-04 ENCOUNTER — Encounter: Payer: BLUE CROSS/BLUE SHIELD | Admitting: Family Medicine

## 2017-03-11 ENCOUNTER — Ambulatory Visit (INDEPENDENT_AMBULATORY_CARE_PROVIDER_SITE_OTHER): Payer: BLUE CROSS/BLUE SHIELD | Admitting: Family Medicine

## 2017-03-11 ENCOUNTER — Encounter: Payer: Self-pay | Admitting: Family Medicine

## 2017-03-11 VITALS — BP 126/88 | HR 77 | Temp 98.2°F | Resp 16 | Ht 64.0 in | Wt 151.0 lb

## 2017-03-11 DIAGNOSIS — M1A09X0 Idiopathic chronic gout, multiple sites, without tophus (tophi): Secondary | ICD-10-CM

## 2017-03-11 DIAGNOSIS — F411 Generalized anxiety disorder: Secondary | ICD-10-CM

## 2017-03-11 DIAGNOSIS — I10 Essential (primary) hypertension: Secondary | ICD-10-CM

## 2017-03-11 DIAGNOSIS — E782 Mixed hyperlipidemia: Secondary | ICD-10-CM

## 2017-03-11 DIAGNOSIS — E034 Atrophy of thyroid (acquired): Secondary | ICD-10-CM | POA: Diagnosis not present

## 2017-03-11 DIAGNOSIS — I471 Supraventricular tachycardia: Secondary | ICD-10-CM | POA: Diagnosis not present

## 2017-03-11 DIAGNOSIS — E7439 Other disorders of intestinal carbohydrate absorption: Secondary | ICD-10-CM

## 2017-03-11 MED ORDER — ALPRAZOLAM 0.25 MG PO TABS: 0.2500 mg | ORAL_TABLET | Freq: Every evening | ORAL | 0 refills | Status: DC | PRN
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
## 2017-03-11 NOTE — Patient Instructions (Signed)
     IF you received an x-ray today, you will receive an invoice from [facility]. Please contact [facility] at [phone] with questions or concerns regarding your invoice.   IF you received labwork today, you will receive an invoice from [facility]. Please contact [facility] at [phone] with questions or concerns regarding your invoice.   Our billing staff will not be able to assist you with questions regarding bills from these companies.  You will be contacted with the lab results as soon as they are available. The fastest way to get your results is to activate your My Chart account. Instructions are located on the last page of this paperwork. If you have not heard from us regarding the results in 2 weeks, please contact this office.     

## 2017-03-12 LAB — CBC WITH DIFFERENTIAL/PLATELET
BASOS: 1 %
Basophils Absolute: 0.1 10*3/uL (ref 0.0–0.2)
EOS (ABSOLUTE): 0.4 10*3/uL (ref 0.0–0.4)
EOS: 3 %
HEMATOCRIT: 44.7 % (ref 34.0–46.6)
Hemoglobin: 15.3 g/dL (ref 11.1–15.9)
Immature Grans (Abs): 0 10*3/uL (ref 0.0–0.1)
Immature Granulocytes: 0 %
LYMPHS ABS: 5.4 10*3/uL — AB (ref 0.7–3.1)
Lymphs: 41 %
MCH: 31 pg (ref 26.6–33.0)
MCHC: 34.2 g/dL (ref 31.5–35.7)
MCV: 91 fL (ref 79–97)
MONOCYTES: 6 %
Monocytes Absolute: 0.8 10*3/uL (ref 0.1–0.9)
NEUTROS ABS: 6.5 10*3/uL (ref 1.4–7.0)
Neutrophils: 49 %
Platelets: 249 10*3/uL (ref 150–379)
RBC: 4.94 x10E6/uL (ref 3.77–5.28)
RDW: 13.3 % (ref 12.3–15.4)
WBC: 13.2 10*3/uL — ABNORMAL HIGH (ref 3.4–10.8)

## 2017-03-12 LAB — TSH: TSH: 24.6 u[IU]/mL — ABNORMAL HIGH (ref 0.450–4.500)

## 2017-03-12 LAB — COMPREHENSIVE METABOLIC PANEL
A/G RATIO: 1.3 (ref 1.2–2.2)
ALBUMIN: 4.1 g/dL (ref 3.6–4.8)
ALT: 20 IU/L (ref 0–32)
AST: 22 IU/L (ref 0–40)
Alkaline Phosphatase: 47 IU/L (ref 39–117)
BILIRUBIN TOTAL: 0.2 mg/dL (ref 0.0–1.2)
BUN/Creatinine Ratio: 32 — ABNORMAL HIGH (ref 12–28)
BUN: 16 mg/dL (ref 8–27)
CHLORIDE: 96 mmol/L (ref 96–106)
CO2: 28 mmol/L (ref 18–29)
Calcium: 9.8 mg/dL (ref 8.7–10.3)
Creatinine, Ser: 0.5 mg/dL — ABNORMAL LOW (ref 0.57–1.00)
GFR calc Af Amer: 121 mL/min/{1.73_m2} (ref >59)
GFR calc non Af Amer: 105 mL/min/{1.73_m2} (ref >59)
GLUCOSE: 80 mg/dL (ref 65–99)
Globulin, Total: 3.2 g/dL (ref 1.5–4.5)
Potassium: 4.1 mmol/L (ref 3.5–5.2)
Sodium: 140 mmol/L (ref 134–144)
Total Protein: 7.3 g/dL (ref 6.0–8.5)

## 2017-03-12 LAB — HEMOGLOBIN A1C
Est. average glucose Bld gHb Est-mCnc: 103 mg/dL
HEMOGLOBIN A1C: 5.2 % (ref 4.8–5.6)

## 2017-03-12 LAB — T4, FREE: Free T4: 1 ng/dL (ref 0.82–1.77)

## 2017-03-12 LAB — VITAMIN D 25 HYDROXY (VIT D DEFICIENCY, FRACTURES): Vit D, 25-Hydroxy: 20.9 ng/mL — ABNORMAL LOW (ref 30.0–100.0)

## 2017-03-12 LAB — URIC ACID: Uric Acid: 8.4 mg/dL — ABNORMAL HIGH (ref 2.5–7.1)

## 2017-04-07 ENCOUNTER — Other Ambulatory Visit: Payer: Self-pay | Admitting: Family Medicine

## 2017-04-07 DIAGNOSIS — D72820 Lymphocytosis (symptomatic): Secondary | ICD-10-CM

## 2017-04-07 DIAGNOSIS — E559 Vitamin D deficiency, unspecified: Secondary | ICD-10-CM

## 2017-04-07 DIAGNOSIS — E034 Atrophy of thyroid (acquired): Secondary | ICD-10-CM

## 2017-04-07 MED ORDER — LEVOTHYROXINE SODIUM 88 MCG PO TABS: 88.0000 ug | ORAL_TABLET | Freq: Every day | ORAL | 3 refills | Status: DC

## 2017-04-08 DIAGNOSIS — F411 Generalized anxiety disorder: Secondary | ICD-10-CM | POA: Insufficient documentation

## 2017-04-14 ENCOUNTER — Other Ambulatory Visit: Payer: Self-pay | Admitting: Family Medicine

## 2017-08-14 ENCOUNTER — Encounter: Payer: Self-pay | Admitting: Podiatry

## 2017-08-14 ENCOUNTER — Ambulatory Visit (INDEPENDENT_AMBULATORY_CARE_PROVIDER_SITE_OTHER): Payer: BLUE CROSS/BLUE SHIELD

## 2017-08-14 ENCOUNTER — Ambulatory Visit (INDEPENDENT_AMBULATORY_CARE_PROVIDER_SITE_OTHER): Payer: BLUE CROSS/BLUE SHIELD | Admitting: Podiatry

## 2017-08-14 VITALS — BP 148/91 | HR 73 | Resp 16

## 2017-08-14 DIAGNOSIS — M779 Enthesopathy, unspecified: Principal | ICD-10-CM

## 2017-08-14 DIAGNOSIS — M722 Plantar fascial fibromatosis: Secondary | ICD-10-CM | POA: Diagnosis not present

## 2017-08-14 DIAGNOSIS — M7750 Other enthesopathy of unspecified foot: Secondary | ICD-10-CM | POA: Diagnosis not present

## 2017-08-14 DIAGNOSIS — M1A0790 Idiopathic chronic gout, unspecified ankle and foot, without tophus (tophi): Secondary | ICD-10-CM

## 2017-08-14 DIAGNOSIS — M778 Other enthesopathies, not elsewhere classified: Secondary | ICD-10-CM

## 2017-08-14 MED ORDER — ALLOPURINOL 100 MG PO TABS: ORAL_TABLET | ORAL | 6 refills | Status: DC

## 2017-08-14 NOTE — Progress Notes (Signed)
Subjective:  Patient ID: Stacy Hart, female    DOB: 04-19-1956,  MRN: [id number] HPI Chief Complaint  Patient presents with  . Foot Pain    1st MPJ bilateral, lateral foot bilateral, arch bilateral - history of gout and plantar fasciitis, flare up few weeks ago of gout in right and now flare in left, swelling, thinks fasciitis is acting up, interested in PT and education to help prevent, needs new Allopurinol Rx    61 y.o. female presents with the above complaint.     Past Medical History:  Diagnosis Date  . Acquired keratoderma   . Allergy   . Anxiety   . Arthritis   . Benign paroxysmal positional vertigo   . Chicken pox    childhood  . Cleft palate   . Esophageal reflux   . Gallstones   . GERD (gastroesophageal reflux disease)   . Gout, unspecified   . Measles    childhood  . Mixed hyperlipidemia   . Other specified conditions influencing health status(V49.89)   . Pain in limb   . Personal history of other diseases of circulatory system   . SVT (supraventricular tachycardia) (HCC)   . Symptomatic menopausal or female climacteric states   . Unspecified asthma(493.90)   . Unspecified essential hypertension   . Unspecified hypothyroidism   . Unspecified vitamin D deficiency   . Vitreous detachment of right eye 03/21/2014   [name]   Past Surgical History:  Procedure Laterality Date  . ABDOMINAL HYSTERECTOMY  2001   Ovaries intact, DUB, uterine fibroids  . COSMETIC SURGERY    . FOOT SURGERY  2009   Left   Morton's Neuroma  . GALLBLADDER SURGERY  1998  . Heart ablation  1996   SVT  . OOPHORECTOMY    . surgeries for cleft palatte and lips     [facility]  . TUBAL LIGATION      Current Outpatient Prescriptions:  .  allopurinol (ZYLOPRIM) 100 MG tablet, Take 100 mg by mouth daily., Disp: , Rfl:  .  ALPRAZolam (XANAX) 0.25 MG tablet, Take 1 tablet (0.25 mg total) by mouth at bedtime as needed for anxiety., Disp: 30 tablet, Rfl: 0 .  cholestyramine (QUESTRAN) 4  g packet, USE 1 PACKET TWICE A DAY AS DIRECTED, Disp: 180 packet, Rfl: 3 .  estradiol (ESTRACE) 2 MG tablet, Take 2 mg by mouth daily., Disp: , Rfl: 5 .  levothyroxine (SYNTHROID) 88 MCG tablet, Take 1 tablet (88 mcg total) by mouth daily before breakfast. BRAND NAME SYNTHROID.  Take 1/2 extra tablet two days per week., Disp: 90 tablet, Rfl: 3 .  loperamide (IMODIUM A-D) 2 MG tablet, Take 2 mg by mouth daily., Disp: , Rfl:  .  omeprazole (PRILOSEC) 20 MG capsule, Take 1 capsule (20 mg total) by mouth daily., Disp: 30 capsule, Rfl: 3 .  triamterene-hydrochlorothiazide (MAXZIDE-25) 37.5-25 MG tablet, TAKE 1 TABLET BY MOUTH EVERY DAY, Disp: 90 tablet, Rfl: 1  Allergies  Allergen Reactions  . Epinephrine     "interferes with heart"  . Sulfa Antibiotics   . Ceftin [Cefuroxime Axetil] Rash   Review of Systems  All other systems reviewed and are negative.  Objective:   Vitals:   08/14/17 1448  BP: (!) 148/91  Pulse: 73  Resp: 16    General: Well developed, nourished, in no acute distress, alert and oriented x3   Dermatological: Skin is warm, dry and supple bilateral. Nails x 10 are well maintained; remaining integument appears unremarkable at this  time. There are no open sores, no preulcerative lesions, no rash or signs of infection present.  Vascular: Dorsalis Pedis artery and Posterior Tibial artery pedal pulses are 2/4 bilateral with immedate capillary fill time. Pedal hair growth present. No varicosities and no lower extremity edema present bilateral.   Neruologic: Grossly intact via light touch bilateral. Vibratory intact via tuning fork bilateral. Protective threshold with Semmes Wienstein monofilament intact to all pedal sites bilateral. Patellar and Achilles deep tendon reflexes 2+ bilateral. No Babinski or clonus noted bilateral.   Musculoskeletal: No gross boney pedal deformities bilateral. No pain, crepitus, or limitation noted with foot and ankle range of motion bilateral.  Muscular strength 5/5 in all groups tested bilateral.She has some tenderness on palpation of the medial calcaneal tubercle bilateral centimeters on range of motion of the first metatarsophalangeal joint bilaterally is mildly swollen and has some remaining postinflammatory hyperpigmentation.  Gait: Unassisted, Nonantalgic.    Radiographs:  Radiographs taken today 3 views bilateral demonstrates osseously mature individual. No fractures are identified. Some early degenerative disease around the first metatarsophalangeal joints with soft tissue increase in density around the joint system with history of gout. There is also some soft tissue increase in density at the plantar fascia insertion site on lateral view.  Assessment & Plan:   Assessment: History of gouty first metatarsophalangeal joints. Plantar fasciitis bilateral.  Plan: Wrote her another prescription for her allopurinol 100 mg 2 tablets daily. Also wrote her a prescription for physical therapy. She is not interested in injections.     Max T. CascadesHyatt, North DakotaDPM

## 2017-08-14 NOTE — Patient Instructions (Signed)

## 2017-09-10 ENCOUNTER — Other Ambulatory Visit: Payer: Self-pay | Admitting: Family Medicine

## 2017-09-17 ENCOUNTER — Encounter: Payer: BLUE CROSS/BLUE SHIELD | Admitting: Family Medicine

## 2017-10-08 ENCOUNTER — Other Ambulatory Visit: Payer: Self-pay | Admitting: Family Medicine

## 2018-03-17 ENCOUNTER — Encounter: Payer: Self-pay | Admitting: Family Medicine

## 2018-03-30 ENCOUNTER — Other Ambulatory Visit: Payer: Self-pay | Admitting: Family Medicine

## 2018-03-31 NOTE — Telephone Encounter (Signed)
Triamterene-HCTZ refill Last Refill:10/08/17  # 90 1 RF Last OV: 03/11/18 (canceled F/U for 09/17/17) PCP: Nilda SimmerKristi Smith MD Pharmacy:[facility] [address]  Called pt and appt made for PCP for med refills and f/u BP 04/29/18

## 2018-04-01 ENCOUNTER — Other Ambulatory Visit: Payer: Self-pay | Admitting: Obstetrics & Gynecology

## 2018-04-01 DIAGNOSIS — Z1231 Encounter for screening mammogram for malignant neoplasm of breast: Secondary | ICD-10-CM

## 2018-04-24 ENCOUNTER — Ambulatory Visit
Admission: RE | Admit: 2018-04-24 | Discharge: 2018-04-24 | Disposition: A | Discharge status: Home / Self Care | Payer: BLUE CROSS/BLUE SHIELD | Source: Ambulatory Visit | Attending: Obstetrics & Gynecology | Admitting: Obstetrics & Gynecology

## 2018-04-24 DIAGNOSIS — Z1231 Encounter for screening mammogram for malignant neoplasm of breast: Secondary | ICD-10-CM | POA: Diagnosis not present

## 2018-04-28 NOTE — Progress Notes (Signed)
Subjective:    Patient ID: Stacy Hart, female    DOB: 1956/01/01, 62 y.o.   MRN: [id number]  04/29/2018  Annual Exam    HPI This 62 y.o. female presents for complete physical examination.  Last physical: 03-05-2016 Pap smear: hysterectomy Mammogram:  01/2017 Colonoscopy:never Bone density:2015 Eye exam:  yearly Dental exam:  yearly   Visual Acuity Screening   Right eye Left eye Both eyes  Without correction:     With correction: 20/20 20/20 20/20     BP Readings from Last 3 Encounters:  04/29/18 (!) 142/82  08/14/17 (!) 148/91  03/11/17 126/88   Wt Readings from Last 3 Encounters:  04/29/18 148 lb (67.1 kg)  03/11/17 151 lb (68.5 kg)  09/03/16 149 lb 6.4 oz (67.8 kg)    There is no immunization history on file for this patient.  Husband retired. Stacy Hart and Stacy Hart split up; Stacy Hart is at home. Started acting funny.  Always on him to take medication.  Moved to [place]. Sold property in [place]. Father in law had dementia; had to sell his house.  He died two years ago. Stacy Hart travels a lot with work.  Gluten free household.  GERD: taking Prilosec daily; food gets stuck.  Hypothyroidism: Patient reports POOR compliance with medication, POOR tolerance to medication, and good symptom control.  Cuts Synthroid 1/2 daily.  Synthroid causes stomach pain.  Stomach feeling so much better when misses synhroid.  Insomnia with supplementation.   Nausea.    HTN: taking maxzide 1/2 bid.    Hot flashes: does not take it daily; feels better if takes daily.  Ever other day.  Not going back to gynecologist.  History of emdometriosis.  IBS: chronic since childhood.  Diarrhea with constipation.  Unable to complete colonoscopy because has not tolerate prep.  Lives near [place] and [place].    Review of Systems  Constitutional: Positive for diaphoresis. Negative for activity change, appetite change, chills, fatigue, fever and unexpected weight change.  HENT:  Positive for congestion, sinus pressure and sinus pain. Negative for dental problem, drooling, ear discharge, ear pain, facial swelling, hearing loss, mouth sores, nosebleeds, postnasal drip, rhinorrhea, sneezing, sore throat, tinnitus, trouble swallowing and voice change.   Eyes: Negative for photophobia, pain, discharge, redness, itching and visual disturbance.  Respiratory: Negative for apnea, cough, choking, chest tightness, shortness of breath, wheezing and stridor.   Cardiovascular: Negative for chest pain, palpitations and leg swelling.  Gastrointestinal: Negative for abdominal distention, abdominal pain, anal bleeding, blood in stool, constipation, diarrhea, nausea, rectal pain and vomiting.  Endocrine: Negative for cold intolerance, heat intolerance, polydipsia, polyphagia and polyuria.  Genitourinary: Negative for decreased urine volume, difficulty urinating, dyspareunia, dysuria, enuresis, flank pain, frequency, genital sores, hematuria, menstrual problem, pelvic pain, urgency, vaginal bleeding, vaginal discharge and vaginal pain.  Musculoskeletal: Positive for back pain. Negative for arthralgias, gait problem, joint swelling, myalgias, neck pain and neck stiffness.  Skin: Negative for color change, pallor, rash and wound.  Allergic/Immunologic: Negative for environmental allergies, food allergies and immunocompromised state.  Neurological: Negative for dizziness, tremors, seizures, syncope, facial asymmetry, speech difficulty, weakness, light-headedness, numbness and headaches.  Hematological: Negative for adenopathy. Does not bruise/bleed easily.  Psychiatric/Behavioral: Positive for sleep disturbance. Negative for agitation, behavioral problems, confusion, decreased concentration, dysphoric mood, hallucinations, self-injury and suicidal ideas. The patient is nervous/anxious. The patient is not hyperactive.     Past Medical History:  Diagnosis Date  . Acquired keratoderma   . Allergy     .  Anxiety   . Arthritis   . Benign paroxysmal positional vertigo   . Chicken pox    childhood  . Cleft palate   . Esophageal reflux   . Gallstones   . GERD (gastroesophageal reflux disease)   . Gout, unspecified   . Measles    childhood  . Mixed hyperlipidemia   . Other specified conditions influencing health status(V49.89)   . Pain in limb   . Personal history of other diseases of circulatory system   . SVT (supraventricular tachycardia) (HCC)   . Symptomatic menopausal or female climacteric states   . Unspecified asthma(493.90)   . Unspecified essential hypertension   . Unspecified hypothyroidism   . Unspecified vitamin D deficiency   . Vitreous detachment of right eye 03/21/2014   Stacy Hart   Past Surgical History:  Procedure Laterality Date  . ABDOMINAL HYSTERECTOMY  2001   Ovaries intact, DUB, uterine fibroids  . COSMETIC SURGERY    . FOOT SURGERY  2009   Left   Morton's Neuroma  . GALLBLADDER SURGERY  1998  . Heart ablation  1996   SVT  . OOPHORECTOMY    . surgeries for cleft palatte and lips     [facility]  . TUBAL LIGATION     Allergies  Allergen Reactions  . Epinephrine     "interferes with heart"  . Sulfa Antibiotics   . Ceftin [Cefuroxime Axetil] Rash   Current Outpatient Medications on File Prior to Visit  Medication Sig Dispense Refill  . allopurinol (ZYLOPRIM) 100 MG tablet Take two tablets by mouth daily 60 tablet 6  . loperamide (IMODIUM A-D) 2 MG tablet Take 2 mg by mouth daily.     No current facility-administered medications on file prior to visit.    Social History   Socioeconomic History  . Marital status: Married    Spouse name: Not on file  . Number of children: 2  . Years of education: 14  . Highest education level: Not on file  Occupational History  . Occupation: Full time Parent  Social Needs  . Financial resource strain: Not on file  . Food insecurity:    Worry: Not on file    Inability: Not on file  . Transportation needs:     Medical: Not on file    Non-medical: Not on file  Tobacco Use  . Smoking status: Former Smoker    Packs/day: 2.00    Years: 7.00    Pack years: 14.00    Types: Cigarettes    Last attempt to quit: 07/23/1983    Years since quitting: 34.8  . Smokeless tobacco: Never Used  Substance and Sexual Activity  . Alcohol use: Yes    Comment: occasonal once a week  . Drug use: No  . Sexual activity: Yes  Lifestyle  . Physical activity:    Days per week: Not on file    Minutes per session: Not on file  . Stress: Not on file  Relationships  . Social connections:    Talks on phone: Not on file    Gets together: Not on file    Attends religious service: Not on file    Active member of club or organization: Not on file    Attends meetings of clubs or organizations: Not on file    Relationship status: Not on file  . Intimate partner violence:    Fear of current or ex partner: Not on file    Emotionally abused: Not on file  Physically abused: Not on file    Forced sexual activity: Not on file  Other Topics Concern  . Not on file  Social History Narrative   Marital status:  Married x 30 years,happily, no domestic abuse.       Children: 2 daughters; no grandchildren.      Lives: with husband, adult daughter.      Employment: homemaker.      Tobacco:  never      Alcohol: social      Drugs:none       Guns:  No guns in the home. Smoke alarm in the home. Caffeine use: None. Well balanced diet.     Exercise history: Light, walking , 20 min., 2 - 3 times weekly. Organ donor: NO. Pt DOES have Living Will. DOES have HCPOA.   Family History  Problem Relation Age of Onset  . Heart disease Mother   . Stroke Mother   . Depression Mother   . Diabetes Mother   . Cancer Mother        tumor behind eye  . Heart disease Father   . Diabetes Sister   . Heart disease Sister   . Diabetes Brother   . Pulmonary embolism Brother   . Deep vein thrombosis Brother   . Stroke Brother        CVA  . Colon  cancer Brother   . Hypertension Sister   . Depression Sister   . Hyperlipidemia Sister   . Anxiety disorder Sister   . Colon cancer Sister        mets  . Celiac disease Daughter        Objective:    BP (!) 142/82   Pulse 76   Temp 98.6 F (37 C) (Oral)   Resp 16   Ht 5' 4.96" (1.65 m)   Wt 148 lb (67.1 kg)   SpO2 97%   BMI 24.66 kg/m  Physical Exam  Constitutional: She is oriented to person, place, and time. She appears well-developed and well-nourished. No distress.  HENT:  Head: Normocephalic and atraumatic.  Right Ear: External ear normal.  Left Ear: External ear normal.  Nose: Nose normal.  Mouth/Throat: Oropharynx is clear and moist.  Eyes: Pupils are equal, round, and reactive to light. Conjunctivae and EOM are normal.  Neck: Normal range of motion and full passive range of motion without pain. Neck supple. No JVD present. Carotid bruit is not present. No thyromegaly present.  Cardiovascular: Normal rate, regular rhythm and normal heart sounds. Exam reveals no gallop and no friction rub.  No murmur heard. Pulmonary/Chest: Effort normal and breath sounds normal. She has no wheezes. She has no rales. Right breast exhibits no inverted nipple, no mass, no nipple discharge, no skin change and no tenderness. Left breast exhibits no inverted nipple, no mass, no nipple discharge, no skin change and no tenderness. No breast swelling, tenderness, discharge or bleeding. Breasts are symmetrical.  Abdominal: Soft. Bowel sounds are normal. She exhibits no distension and no mass. There is no tenderness. There is no rebound and no guarding.  Musculoskeletal:       Right shoulder: Normal.       Left shoulder: Normal.       Cervical back: Normal.  Lymphadenopathy:    She has no cervical adenopathy.  Neurological: She is alert and oriented to person, place, and time. She has normal reflexes. No cranial nerve deficit. She exhibits normal muscle tone. Coordination normal.  Skin: Skin is  warm  and dry. No rash noted. She is not diaphoretic. No erythema. No pallor.  Psychiatric: She has a normal mood and affect. Her behavior is normal. Judgment and thought content normal.  Nursing note and vitals reviewed.  No results found. Depression screen [facility] 2/9 04/29/2018 03/11/2017 09/03/2016 05/31/2016 05/04/2016  Decreased Interest 0 0 0 0 0  Down, Depressed, Hopeless 0 0 0 0 0  PHQ - 2 Score 0 0 0 0 0   Fall Risk  04/29/2018 03/11/2017 09/03/2016 05/31/2016 05/04/2016  Falls in the past year? No No No No No        Assessment & Plan:   1. Routine physical examination   2. Essential hypertension   3. Hypothyroidism due to acquired atrophy of thyroid   4. SVT (supraventricular tachycardia) (HCC)   5. Generalized anxiety disorder   6. Mixed hyperlipidemia   7. Glucose intolerance   8. Idiopathic chronic gout of right foot without tophus   9. Colon cancer screening   10. Family history of colon cancer   11. Family history of celiac disease   12. Acute recurrent maxillary sinusitis     -anticipatory guidance provided --- exercise, weight loss, safe driving practices, calcium 3 servings daily. -obtain age appropriate screening labs and labs for chronic disease management.  Acute sinusitis: Recurrent.  Prescription for amoxicillin and prednisone provided. Hypertension: Moderately controlled.  Patient noncompliant with Maxide 1 tablet daily.  Encouraged compliance with medication therapy. Hypothyroidism: Uncontrolled due to noncompliance with daily medication. Generalized anxiety disorder: Worsening due to multiple family stressors in the past 2 years.  Counseling provided during visit.  Refill of Xanax provided for as needed use. Colon cancer screening with a family history of colon cancer in a sister: Patient intolerant to colonoscopy prep in the past year.  Has not followed up with GI to see if there is a different prep option.  Order Cologuard yet patient then asked to hold off on  Cologuard to see if insurance covers.  This is an inferior test with a family history of colon cancer however better than no screening at all.  Consider CT screening for colon cancer which is also an inferior test colonoscopy yet better than no screening.   Orders Placed This Encounter  Procedures  . CBC with Differential/Platelet  . Comprehensive metabolic panel    Order Specific Question:   Has the patient fasted?    Answer:   No  . Hemoglobin A1c  . Lipid panel    Order Specific Question:   Has the patient fasted?    Answer:   No  . TSH  . Vitamin D, 25-hydroxy  . T4, Free  . Uric acid  . Cologuard  . POCT urinalysis dipstick  . EKG 12-Lead   Meds ordered this encounter  Medications  . ALPRAZolam (XANAX) 0.25 MG tablet    Sig: Take 1 tablet (0.25 mg total) by mouth at bedtime as needed for anxiety.    Dispense:  30 tablet    Refill:  1  . estradiol (ESTRACE) 2 MG tablet    Sig: Take 1 tablet (2 mg total) by mouth daily.    Dispense:  90 tablet    Refill:  1  . omeprazole (PRILOSEC) 20 MG capsule    Sig: Take 1 capsule (20 mg total) by mouth daily.    Dispense:  90 capsule    Refill:  3  . triamterene-hydrochlorothiazide (MAXZIDE-25) 37.5-25 MG tablet    Sig: Take 1 tablet by mouth  daily.    Dispense:  90 tablet    Refill:  3  . amoxicillin (AMOXIL) 500 MG tablet    Sig: Take 2 tablets (1,000 mg total) by mouth 2 (two) times daily.    Dispense:  40 tablet    Refill:  0  . predniSONE (DELTASONE) 20 MG tablet    Sig: Take 3 PO QAM x 1 day, 2 PO QAM x 5 days, 1 PO QAM x 5 days    Dispense:  18 tablet    Refill:  0  . levothyroxine (SYNTHROID) 88 MCG tablet    Sig: Take 1 tablet (88 mcg total) by mouth daily before breakfast. BRAND NAME SYNTHROID.    Dispense:  90 tablet    Refill:  3  . cholestyramine (QUESTRAN) 4 g packet    Sig: USE 1 PACKET TWICE A DAY AS DIRECTED    Dispense:  180 packet    Refill:  3    Return in about 6 months (around 10/30/2018) for  follow-up chronic medical  [facility].   Kristi Paulita Fujita, M.D. Primary Care at [facility] previously [facility] [address] [place], [place]  [address] [phone] phone [phone] fax

## 2018-04-29 ENCOUNTER — Ambulatory Visit (INDEPENDENT_AMBULATORY_CARE_PROVIDER_SITE_OTHER): Payer: BLUE CROSS/BLUE SHIELD | Admitting: Family Medicine

## 2018-04-29 ENCOUNTER — Other Ambulatory Visit: Payer: Self-pay

## 2018-04-29 ENCOUNTER — Encounter: Payer: Self-pay | Admitting: Family Medicine

## 2018-04-29 VITALS — BP 142/82 | HR 76 | Temp 98.6°F | Resp 16 | Ht 64.96 in | Wt 148.0 lb

## 2018-04-29 DIAGNOSIS — J0101 Acute recurrent maxillary sinusitis: Secondary | ICD-10-CM | POA: Diagnosis not present

## 2018-04-29 DIAGNOSIS — E782 Mixed hyperlipidemia: Secondary | ICD-10-CM

## 2018-04-29 DIAGNOSIS — I471 Supraventricular tachycardia, unspecified: Secondary | ICD-10-CM

## 2018-04-29 DIAGNOSIS — F411 Generalized anxiety disorder: Secondary | ICD-10-CM

## 2018-04-29 DIAGNOSIS — Z8379 Family history of other diseases of the digestive system: Secondary | ICD-10-CM

## 2018-04-29 DIAGNOSIS — I10 Essential (primary) hypertension: Secondary | ICD-10-CM | POA: Diagnosis not present

## 2018-04-29 DIAGNOSIS — E7439 Other disorders of intestinal carbohydrate absorption: Secondary | ICD-10-CM

## 2018-04-29 DIAGNOSIS — Z1211 Encounter for screening for malignant neoplasm of colon: Secondary | ICD-10-CM

## 2018-04-29 DIAGNOSIS — Z800 Family history of malignant neoplasm of digestive organs: Secondary | ICD-10-CM | POA: Diagnosis not present

## 2018-04-29 DIAGNOSIS — E034 Atrophy of thyroid (acquired): Secondary | ICD-10-CM | POA: Diagnosis not present

## 2018-04-29 DIAGNOSIS — Z0000 Encounter for general adult medical examination without abnormal findings: Secondary | ICD-10-CM | POA: Diagnosis not present

## 2018-04-29 DIAGNOSIS — M1A0710 Idiopathic chronic gout, right ankle and foot, without tophus (tophi): Secondary | ICD-10-CM

## 2018-04-29 LAB — POCT URINALYSIS DIP (MANUAL ENTRY)
Bilirubin, UA: NEGATIVE
GLUCOSE UA: NEGATIVE mg/dL
Ketones, POC UA: NEGATIVE mg/dL
Leukocytes, UA: NEGATIVE
Nitrite, UA: NEGATIVE
Protein Ur, POC: NEGATIVE mg/dL
SPEC GRAV UA: 1.02 (ref 1.010–1.025)
Urobilinogen, UA: 0.2 E.U./dL
pH, UA: 5 (ref 5.0–8.0)

## 2018-04-29 MED ORDER — ESTRADIOL 2 MG PO TABS: 2.0000 mg | ORAL_TABLET | Freq: Every day | ORAL | 1 refills | Status: DC

## 2018-04-29 MED ORDER — ALPRAZOLAM 0.25 MG PO TABS: 0.2500 mg | ORAL_TABLET | Freq: Every evening | ORAL | 1 refills | Status: DC | PRN

## 2018-04-29 MED ORDER — TRIAMTERENE-HCTZ 37.5-25 MG PO TABS: 1.0000 | ORAL_TABLET | Freq: Every day | ORAL | 3 refills | Status: DC

## 2018-04-29 MED ORDER — OMEPRAZOLE 20 MG PO CPDR: 20.0000 mg | DELAYED_RELEASE_CAPSULE | Freq: Every day | ORAL | 3 refills | Status: AC

## 2018-04-29 MED ORDER — LEVOTHYROXINE SODIUM 88 MCG PO TABS: 88.0000 ug | ORAL_TABLET | Freq: Every day | ORAL | 3 refills | Status: DC

## 2018-04-29 MED ORDER — CHOLESTYRAMINE 4 G PO PACK: PACK | ORAL | 3 refills | Status: DC

## 2018-04-29 MED ORDER — AMOXICILLIN 500 MG PO TABS: 1000.0000 mg | ORAL_TABLET | Freq: Two times a day (BID) | ORAL | 0 refills | Status: DC

## 2018-04-29 MED ORDER — PREDNISONE 20 MG PO TABS: ORAL_TABLET | ORAL | 0 refills | Status: DC

## 2018-04-29 NOTE — Patient Instructions (Addendum)
IF you received an x-ray today, you will receive an invoice from [facility]. Please contact [facility] at [phone] with questions or concerns regarding your invoice.   IF you received labwork today, you will receive an invoice from [facility]. Please contact [facility] at [phone] with questions or concerns regarding your invoice.   Our billing staff will not be able to assist you with questions regarding bills from these companies.  You will be contacted with the lab results as soon as they are available. The fastest way to get your results is to activate your My Chart account. Instructions are located on the last page of this paperwork. If you have not heard from [place] regarding the results in 2 weeks, please contact this office.      Preventive Care 40-64 Years, Female Preventive care refers to lifestyle choices and visits with your health care provider that can promote health and wellness. What does preventive care include?  A yearly physical exam. This is also called an annual well check.  Dental exams once or twice a year.  Routine eye exams. Ask your health care provider how often you should have your eyes checked.  Personal lifestyle choices, including: ? Daily care of your teeth and gums. ? Regular physical activity. ? Eating a healthy diet. ? Avoiding tobacco and drug use. ? Limiting alcohol use. ? Practicing safe sex. ? Taking low-dose aspirin daily starting at age 34. ? Taking vitamin and mineral supplements as recommended by your health care provider. What happens during an annual well check? The services and screenings done by your health care provider during your annual well check will depend on your age, overall health, lifestyle risk factors, and family history of disease. Counseling Your health care provider may ask you questions about your:  Alcohol use.  Tobacco use.  Drug use.  Emotional well-being.  Home and relationship  well-being.  Sexual activity.  Eating habits.  Work and work Statistician.  Method of birth control.  Menstrual cycle.  Pregnancy history.  Screening You may have the following tests or measurements:  Height, weight, and BMI.  Blood pressure.  Lipid and cholesterol levels. These may be checked every 5 years, or more frequently if you are over 65 years old.  Skin check.  Lung cancer screening. You may have this screening every year starting at age 102 if you have a 30-pack-year history of smoking and currently smoke or have quit within the past 15 years.  Fecal occult blood test (FOBT) of the stool. You may have this test every year starting at age 33.  Flexible sigmoidoscopy or colonoscopy. You may have a sigmoidoscopy every 5 years or a colonoscopy every 10 years starting at age 23.  Hepatitis C blood test.  Hepatitis B blood test.  Sexually transmitted disease (STD) testing.  Diabetes screening. This is done by checking your blood sugar (glucose) after you have not eaten for a while (fasting). You may have this done every 1-3 years.  Mammogram. This may be done every 1-2 years. Talk to your health care provider about when you should start having regular mammograms. This may depend on whether you have a family history of breast cancer.  BRCA-related cancer screening. This may be done if you have a family history of breast, ovarian, tubal, or peritoneal cancers.  Pelvic exam and Pap test. This may be done every 3 years starting at age 3. Starting at age 55, this may be done every 5 years if  you have a Pap test in combination with an HPV test.  Bone density scan. This is done to screen for osteoporosis. You may have this scan if you are at high risk for osteoporosis.  Discuss your test results, treatment options, and if necessary, the need for more tests with your health care provider. Vaccines Your health care provider may recommend certain vaccines, such  as:  Influenza vaccine. This is recommended every year.  Tetanus, diphtheria, and acellular pertussis (Tdap, Td) vaccine. You may need a Td booster every 10 years.  Varicella vaccine. You may need this if you have not been vaccinated.  Zoster vaccine. You may need this after age 77.  Measles, mumps, and rubella (MMR) vaccine. You may need at least one dose of MMR if you were born in 1957 or later. You may also need a second dose.  Pneumococcal 13-valent conjugate (PCV13) vaccine. You may need this if you have certain conditions and were not previously vaccinated.  Pneumococcal polysaccharide (PPSV23) vaccine. You may need one or two doses if you smoke cigarettes or if you have certain conditions.  Meningococcal vaccine. You may need this if you have certain conditions.  Hepatitis A vaccine. You may need this if you have certain conditions or if you travel or work in places where you may be exposed to hepatitis A.  Hepatitis B vaccine. You may need this if you have certain conditions or if you travel or work in places where you may be exposed to hepatitis B.  Haemophilus influenzae type b (Hib) vaccine. You may need this if you have certain conditions.  Talk to your health care provider about which screenings and vaccines you need and how often you need them. This information is not intended to replace advice given to you by your health care provider. Make sure you discuss any questions you have with your health care provider. Document Released: 11/03/2015 Document Revised: 06/26/2016 Document Reviewed: 08/08/2015 Elsevier Interactive Patient Education  [employer].

## 2018-04-30 LAB — COMPREHENSIVE METABOLIC PANEL
A/G RATIO: 1.5 (ref 1.2–2.2)
ALBUMIN: 4.8 g/dL (ref 3.6–4.8)
ALT: 27 IU/L (ref 0–32)
AST: 26 IU/L (ref 0–40)
Alkaline Phosphatase: 47 IU/L (ref 39–117)
BUN / CREAT RATIO: 20 (ref 12–28)
BUN: 14 mg/dL (ref 8–27)
Bilirubin Total: 0.5 mg/dL (ref 0.0–1.2)
CALCIUM: 9.8 mg/dL (ref 8.7–10.3)
CHLORIDE: 99 mmol/L (ref 96–106)
CO2: 23 mmol/L (ref 20–29)
Creatinine, Ser: 0.71 mg/dL (ref 0.57–1.00)
GFR, EST AFRICAN AMERICAN: 105 mL/min/{1.73_m2} (ref >59)
GFR, EST NON AFRICAN AMERICAN: 91 mL/min/{1.73_m2} (ref >59)
GLOBULIN, TOTAL: 3.2 g/dL (ref 1.5–4.5)
Glucose: 79 mg/dL (ref 65–99)
POTASSIUM: 3.7 mmol/L (ref 3.5–5.2)
SODIUM: 143 mmol/L (ref 134–144)
TOTAL PROTEIN: 8 g/dL (ref 6.0–8.5)

## 2018-04-30 LAB — LIPID PANEL
CHOL/HDL RATIO: 5.3 ratio — AB (ref 0.0–4.4)
CHOLESTEROL TOTAL: 189 mg/dL (ref 100–199)
HDL: 36 mg/dL — ABNORMAL LOW (ref >39)
Triglycerides: 471 mg/dL — ABNORMAL HIGH (ref 0–149)

## 2018-04-30 LAB — CBC WITH DIFFERENTIAL/PLATELET
BASOS: 1 %
Basophils Absolute: 0.1 10*3/uL (ref 0.0–0.2)
EOS (ABSOLUTE): 0.3 10*3/uL (ref 0.0–0.4)
EOS: 3 %
HEMATOCRIT: 47.1 % — AB (ref 34.0–46.6)
Hemoglobin: 15.6 g/dL (ref 11.1–15.9)
IMMATURE GRANS (ABS): 0 10*3/uL (ref 0.0–0.1)
IMMATURE GRANULOCYTES: 0 %
LYMPHS: 38 %
Lymphocytes Absolute: 4.7 10*3/uL — ABNORMAL HIGH (ref 0.7–3.1)
MCH: 30.3 pg (ref 26.6–33.0)
MCHC: 33.1 g/dL (ref 31.5–35.7)
MCV: 92 fL (ref 79–97)
MONOS ABS: 0.6 10*3/uL (ref 0.1–0.9)
Monocytes: 5 %
Neutrophils Absolute: 6.6 10*3/uL (ref 1.4–7.0)
Neutrophils: 53 %
PLATELETS: 230 10*3/uL (ref 150–450)
RBC: 5.15 x10E6/uL (ref 3.77–5.28)
RDW: 13.1 % (ref 12.3–15.4)
WBC: 12.4 10*3/uL — ABNORMAL HIGH (ref 3.4–10.8)

## 2018-04-30 LAB — HEMOGLOBIN A1C
Est. average glucose Bld gHb Est-mCnc: 108 mg/dL
Hgb A1c MFr Bld: 5.4 % (ref 4.8–5.6)

## 2018-04-30 LAB — TSH: TSH: 41.98 u[IU]/mL — AB (ref 0.450–4.500)

## 2018-04-30 LAB — VITAMIN D 25 HYDROXY (VIT D DEFICIENCY, FRACTURES): VIT D 25 HYDROXY: 21.7 ng/mL — AB (ref 30.0–100.0)

## 2018-04-30 LAB — URIC ACID: Uric Acid: 7.3 mg/dL — ABNORMAL HIGH (ref 2.5–7.1)

## 2018-04-30 LAB — T4, FREE: FREE T4: 0.68 ng/dL — AB (ref 0.82–1.77)

## 2018-05-14 ENCOUNTER — Encounter: Payer: Self-pay | Admitting: Family Medicine

## 2018-05-23 MED ORDER — VITAMIN D (ERGOCALCIFEROL) 1.25 MG (50000 UNIT) PO CAPS: 50000.0000 [IU] | ORAL_CAPSULE | ORAL | 0 refills | Status: DC

## 2018-05-23 NOTE — Addendum Note (Signed)
Addended by: Ethelda ChickSMITH, KRISTI M on: 05/23/2018 12:21 PM   Modules accepted: Orders

## 2019-08-23 ENCOUNTER — Ambulatory Visit: Payer: BC Managed Care – PPO | Admitting: Family Medicine

## 2019-08-23 ENCOUNTER — Encounter: Payer: Self-pay | Admitting: Family Medicine

## 2019-08-23 ENCOUNTER — Other Ambulatory Visit: Payer: Self-pay

## 2019-08-23 VITALS — BP 146/92 | HR 78 | Temp 97.6°F | Wt 152.6 lb

## 2019-08-23 DIAGNOSIS — E034 Atrophy of thyroid (acquired): Secondary | ICD-10-CM | POA: Diagnosis not present

## 2019-08-23 DIAGNOSIS — M109 Gout, unspecified: Secondary | ICD-10-CM

## 2019-08-23 DIAGNOSIS — E7439 Other disorders of intestinal carbohydrate absorption: Secondary | ICD-10-CM | POA: Diagnosis not present

## 2019-08-23 DIAGNOSIS — F411 Generalized anxiety disorder: Secondary | ICD-10-CM | POA: Diagnosis not present

## 2019-08-23 DIAGNOSIS — M1990 Unspecified osteoarthritis, unspecified site: Secondary | ICD-10-CM | POA: Diagnosis not present

## 2019-08-23 DIAGNOSIS — I10 Essential (primary) hypertension: Secondary | ICD-10-CM

## 2019-08-23 DIAGNOSIS — E782 Mixed hyperlipidemia: Secondary | ICD-10-CM | POA: Diagnosis not present

## 2019-08-23 LAB — CBC WITH DIFFERENTIAL/PLATELET
Basophils Absolute: 0.1 10*3/uL (ref 0.0–0.1)
Basophils Relative: 1.3 % (ref 0.0–3.0)
Eosinophils Absolute: 0.4 10*3/uL (ref 0.0–0.7)
Eosinophils Relative: 4.2 % (ref 0.0–5.0)
HCT: 43.8 % (ref 36.0–46.0)
Hemoglobin: 14.9 g/dL (ref 12.0–15.0)
Lymphocytes Relative: 40.7 % (ref 12.0–46.0)
Lymphs Abs: 4.1 10*3/uL — ABNORMAL HIGH (ref 0.7–4.0)
MCHC: 34 g/dL (ref 30.0–36.0)
MCV: 90.9 fl (ref 78.0–100.0)
Monocytes Absolute: 0.5 10*3/uL (ref 0.1–1.0)
Monocytes Relative: 5.1 % (ref 3.0–12.0)
Neutro Abs: 4.9 10*3/uL (ref 1.4–7.7)
Neutrophils Relative %: 48.7 % (ref 43.0–77.0)
Platelets: 197 10*3/uL (ref 150.0–400.0)
RBC: 4.82 Mil/uL (ref 3.87–5.11)
RDW: 12.9 % (ref 11.5–15.5)
WBC: 10 10*3/uL (ref 4.0–10.5)

## 2019-08-23 LAB — COMPREHENSIVE METABOLIC PANEL
ALT: 19 U/L (ref 0–35)
AST: 18 U/L (ref 0–37)
Albumin: 4.4 g/dL (ref 3.5–5.2)
Alkaline Phosphatase: 44 U/L (ref 39–117)
BUN: 15 mg/dL (ref 6–23)
CO2: 29 mEq/L (ref 19–32)
Calcium: 9.4 mg/dL (ref 8.4–10.5)
Chloride: 100 mEq/L (ref 96–112)
Creatinine, Ser: 0.64 mg/dL (ref 0.40–1.20)
GFR: 93.29 mL/min (ref >60.00)
Glucose, Bld: 93 mg/dL (ref 70–99)
Potassium: 3.7 mEq/L (ref 3.5–5.1)
Sodium: 139 mEq/L (ref 135–145)
Total Bilirubin: 0.5 mg/dL (ref 0.2–1.2)
Total Protein: 7.3 g/dL (ref 6.0–8.3)

## 2019-08-23 LAB — HEMOGLOBIN A1C: Hgb A1c MFr Bld: 5.4 % (ref 4.6–6.5)

## 2019-08-23 LAB — LIPID PANEL
Cholesterol: 172 mg/dL (ref 0–200)
HDL: 35.8 mg/dL — ABNORMAL LOW (ref >39.00)
NonHDL: 135.72
Total CHOL/HDL Ratio: 5
Triglycerides: 318 mg/dL — ABNORMAL HIGH (ref 0.0–149.0)
VLDL: 63.6 mg/dL — ABNORMAL HIGH (ref 0.0–40.0)

## 2019-08-23 LAB — TSH: TSH: 41.69 u[IU]/mL — ABNORMAL HIGH (ref 0.35–4.50)

## 2019-08-23 LAB — LDL CHOLESTEROL, DIRECT: Direct LDL: 79 mg/dL

## 2019-08-23 LAB — SEDIMENTATION RATE: Sed Rate: 9 mm/hr (ref 0–30)

## 2019-08-23 LAB — C-REACTIVE PROTEIN: CRP: <1 mg/dL (ref 0.5–20.0)

## 2019-08-23 LAB — URIC ACID: Uric Acid, Serum: 7.5 mg/dL — ABNORMAL HIGH (ref 2.4–7.0)

## 2019-08-23 NOTE — Progress Notes (Signed)
Stacy Hart DOB: 1955/11/20 Encounter date: 08/23/2019  This is a 63 y.o. female who presents to establish care. Chief Complaint  Patient presents with  . Establish Care    History of present illness: Wanted to get established here. Her doctor left practice.   Has been about a year and a half since being to doc, so wants to schedule physical.   IBS: uses the cholestyramine to bulk stool and this really helps her with bowel regularity. Has seen Dr[facility] Christella [facility]Jacobs at [facility]; supposed to have colonoscopy but started throwing up with prep. Uses imodium with traveling.   GERD: Prilosec helps a lot.   Hypothyroid: Taking the 88mcg synthroid about 4 days/week. Last TSH was 41.9 (checked last over a year ago).   HTN: feels like medicine makes her dizzy. Missed dose yesterday and didn't feel dizzy. Ablation wasn't completely successful. Has been on other bp meds but they made her heart rate feel "weird". Checked pressure Saturday and was 137/84. This morning pressure was high, but she forgot medication yesterday. She gets anxious about coming into new doctor. Has been on lisinopril in past: this made her heart "weird"- describes this as rapid heart beat. Has been on metoprolol in past: will use this if heart racing; doesn't usually take this. Still can't drink tea. Even would increase heart rate on decaf tea.   Uses the xanax at bedtime or when traveling. Takes just half of a half which works for her. Has anxiety. Thyroid medication hypes her up. Feels like she doesn't tolerate medication well. Anxiety has always been there; but feels it is worse now. Has two of adult children in house, multiple deaths in family. Not depressed.   Didn't want to get mammogram this July due to COVID.   Pap smears were normal. Had hysterectomy for fibroid tumors. Had some side pain about 6 years ago and ended up getting pelvic US; had ovarian cysts. These were benign.   Also mentions at the end of the visit that  she has a lot of different joint pains.  Notes pain in her wrists knees sometimes elbows.  Past Medical History:  Diagnosis Date  . Acquired keratoderma   . Allergy   . Anxiety   . Arthritis   . Benign paroxysmal positional vertigo   . Chicken pox    childhood  . Cleft palate   . Esophageal reflux   . Gallstones   . GERD (gastroesophageal reflux disease)   . Gout, unspecified   . Measles    childhood  . Mixed hyperlipidemia   . Other specified conditions influencing health status(V49.89)   . Pain in limb   . Personal history of other diseases of circulatory system   . SVT (supraventricular tachycardia) (HCC)   . Symptomatic menopausal or female climacteric states   . Unspecified asthma(493.90)   . Unspecified essential hypertension   . Unspecified hypothyroidism   . Unspecified vitamin D deficiency   . Vitreous detachment of right eye 03/21/2014   Allyne GeeSanders   Past Surgical History:  Procedure Laterality Date  . ABDOMINAL HYSTERECTOMY  2001   Ovaries intact, DUB, uterine fibroids  . COSMETIC SURGERY    . FOOT SURGERY  2009   Left   Morton's Neuroma  . GALLBLADDER SURGERY  1998  . Heart ablation  1996   SVT  . OOPHORECTOMY  2014  . surgeries for cleft palatte and lips     [facility]  . TUBAL LIGATION     Allergies  Allergen Reactions  . Epinephrine     "interferes with heart"  . Sulfa Antibiotics   . Ceftin [Cefuroxime Axetil] Rash   Current Meds  Medication Sig  . ALPRAZolam (XANAX) 0.25 MG tablet Take 1 tablet (0.25 mg total) by mouth at bedtime as needed for anxiety.  . cholestyramine (QUESTRAN) 4 g packet USE 1 PACKET TWICE A DAY AS DIRECTED  . estradiol (ESTRACE) 2 MG tablet Take 1 tablet (2 mg total) by mouth daily.  Marland Kitchen levothyroxine (SYNTHROID) 88 MCG tablet Take 1 tablet (88 mcg total) by mouth daily before breakfast. BRAND NAME SYNTHROID. (Patient taking differently: Take 88 mcg by mouth 2 (two) times a week. BRAND NAME SYNTHROID.)  . loperamide (IMODIUM A-D) 2  MG tablet Take 2 mg by mouth daily.  Marland Kitchen omeprazole (PRILOSEC) 20 MG capsule Take 1 capsule (20 mg total) by mouth daily.  Marland Kitchen triamterene-hydrochlorothiazide (MAXZIDE-25) 37.5-25 MG tablet Take 1 tablet by mouth daily.   Social History   Tobacco Use  . Smoking status: Former Smoker    Packs/day: 2.00    Years: 7.00    Pack years: 14.00    Types: Cigarettes    Quit date: 07/23/1983    Years since quitting: 36.1  . Smokeless tobacco: Never Used  Substance Use Topics  . Alcohol use: Yes    Comment: occasonal once a week   Family History  Problem Relation Age of Onset  . Heart disease Mother   . Stroke Mother   . Depression Mother   . Diabetes Mother   . Cancer Mother 43       tumor behind eye  . Heart disease Father   . Diabetes Sister   . Heart disease Sister   . Diabetes Brother   . Pulmonary embolism Brother   . Deep vein thrombosis Brother   . Stroke Brother        CVA  . Colon cancer Brother   . Hypertension Sister   . Depression Sister   . Hyperlipidemia Sister   . Anxiety disorder Sister   . Colon cancer Sister 82       mets, may have been liver  . Celiac disease Daughter      Review of Systems  Constitutional: Negative for chills, fatigue and fever.  Respiratory: Negative for cough, chest tightness, shortness of breath and wheezing.   Cardiovascular: Negative for chest pain, palpitations and leg swelling.  Gastrointestinal: Positive for diarrhea.  Musculoskeletal: Positive for arthralgias.  Neurological: Positive for dizziness (when she takes bp med).    Objective:  BP (!) 146/92 (BP Location: Left Arm, Patient Position: Sitting, Cuff Size: Large)   Pulse 78   Temp 97.6 F (36.4 C) (Temporal)   Wt 152 lb 9.6 oz (69.2 kg)   SpO2 98%   BMI 25.42 kg/m   Weight: 152 lb 9.6 oz (69.2 kg)   BP Readings from Last 3 Encounters:  08/23/19 (!) 146/92  04/29/18 (!) 142/82  08/14/17 (!) 148/91   Wt Readings from Last 3 Encounters:  08/23/19 152 lb 9.6 oz  (69.2 kg)  04/29/18 148 lb (67.1 kg)  03/11/17 151 lb (68.5 kg)    Physical Exam Constitutional:      General: She is not in acute distress.    Appearance: She is well-developed.  Cardiovascular:     Rate and Rhythm: Normal rate and regular rhythm.     Heart sounds: Normal heart sounds. No murmur. No friction rub.  Pulmonary:     Effort:  Pulmonary effort is normal. No respiratory distress.     Breath sounds: Normal breath sounds. No wheezing or rales.  Musculoskeletal:     Right lower leg: No edema.     Left lower leg: No edema.  Neurological:     Mental Status: She is alert and oriented to person, place, and time.  Psychiatric:        Mood and Affect: Mood is anxious.        Speech: Speech is tangential.        Behavior: Behavior normal.     Assessment/Plan:  1. Essential hypertension Blood pressures not well controlled.  Initial blood pressure was in the 140s over 90, recheck was 150/100.  Patient states she is very anxious and always gets anxious at the doctor's office, but we did talk about trying to control blood pressure overall to compensate for elevations to her anxiety.  She does not like her blood pressure medication, but is hesitant to change medication.  She cannot be specific about details of intolerance with previous lisinopril, but feels her current blood pressure medication makes her dizzy when she takes it.  We will have her check her pressures at home to see how she is running overall and then can discuss changes pending updated blood work as well. - CBC with Differential/Platelet; Future - Comprehensive metabolic panel; Future - Comprehensive metabolic panel - CBC with Differential/Platelet  2. Hypothyroidism due to acquired atrophy of thyroid She is not taking her thyroid medication regularly.  She feels that she has side effects from taking this and feels that the side effects of gotten worse that she is gotten older.  We could consider trying [place]our Thyroid  or trying a lower dose of Synthroid that she may better tolerate and is able to take on a regular basis.  We will see what TSH is and determine what to do pending that result.  3. Mixed hyperlipidemia - Lipid panel; Future - TSH; Future - TSH - Lipid panel  4. Generalized anxiety disorder Uses Xanax intermittently.  We briefly discussed health does not a good long-term plan.  We will have to revisit this at future visit.  She does try to limit use.  5. Glucose intolerance - Hemoglobin A1c; Future - Hemoglobin A1c  6. Gout, unspecified cause, unspecified chronicity, unspecified site - Uric acid; Future - Uric acid   8. Arthritis - C-reactive protein; Future - Sedimentation rate; Future - ANA; Future - Rheumatoid factor; Future - Rheumatoid factor - ANA - Sedimentation rate - C-reactive protein  Return for physical exam.  Micheline Rough, MD

## 2019-08-23 NOTE — Patient Instructions (Addendum)
Check with insurance about cologuard coverage.  Recombinant Zoster (Shingles) Vaccine: What You Need to Know 1. Why get vaccinated? Recombinant zoster (shingles) vaccine can prevent shingles. Shingles (also called herpes zoster, or just zoster) is a painful skin rash, usually with blisters. In addition to the rash, shingles can cause fever, headache, chills, or upset stomach. More rarely, shingles can lead to pneumonia, hearing problems, blindness, brain inflammation (encephalitis), or death. The most common complication of shingles is long-term nerve pain called postherpetic neuralgia (PHN). PHN occurs in the areas where the shingles rash was, even after the rash clears up. It can last for months or years after the rash goes away. The pain from PHN can be severe and debilitating. About 10 to 18% of people who get shingles will experience PHN. The risk of PHN increases with age. An older adult with shingles is more likely to develop PHN and have longer lasting and more severe pain than a younger person with shingles. Shingles is caused by the varicella zoster virus, the same virus that causes chickenpox. After you have chickenpox, the virus stays in your body and can cause shingles later in life. Shingles cannot be passed from one person to another, but the virus that causes shingles can spread and cause chickenpox in someone who had never had chickenpox or received chickenpox vaccine. 2. Recombinant shingles vaccine Recombinant shingles vaccine provides strong protection against shingles. By preventing shingles, recombinant shingles vaccine also protects against PHN. Recombinant shingles vaccine is the preferred vaccine for the prevention of shingles. However, a different vaccine, live shingles vaccine, may be used in some circumstances. The recombinant shingles vaccine is recommended for adults 50 years and older without serious immune problems. It is given as a two-dose series. This vaccine is also  recommended for people who have already gotten another type of shingles vaccine, the live shingles vaccine. There is no live virus in this vaccine. Shingles vaccine may be given at the same time as other vaccines. 3. Talk with your health care provider Tell your vaccine provider if the person getting the vaccine:  Has had an allergic reaction after a previous dose of recombinant shingles vaccine, or has any severe, life-threatening allergies.  Is pregnant or breastfeeding.  Is currently experiencing an episode of shingles. In some cases, your health care provider may decide to postpone shingles vaccination to a future visit. People with minor illnesses, such as a cold, may be vaccinated. People who are moderately or severely ill should usually wait until they recover before getting recombinant shingles vaccine. Your health care provider can give you more information. 4. Risks of a vaccine reaction  A sore arm with mild or moderate pain is very common after recombinant shingles vaccine, affecting about 80% of vaccinated people. Redness and swelling can also happen at the site of the injection.  Tiredness, muscle pain, headache, shivering, fever, stomach pain, and nausea happen after vaccination in more than half of people who receive recombinant shingles vaccine. In clinical trials, about 1 out of 6 people who got recombinant zoster vaccine experienced side effects that prevented them from doing regular activities. Symptoms usually went away on their own in 2 to 3 days. You should still get the second dose of recombinant zoster vaccine even if you had one of these reactions after the first dose. People sometimes faint after medical procedures, including vaccination. Tell your provider if you feel dizzy or have vision changes or ringing in the ears. As with any medicine, there is a  very remote chance of a vaccine causing a severe allergic reaction, other serious injury, or death. 5. What if there  is a serious problem? An allergic reaction could occur after the vaccinated person leaves the clinic. If you see signs of a severe allergic reaction (hives, swelling of the face and throat, difficulty breathing, a fast heartbeat, dizziness, or weakness), call 9-1-1 and get the person to the nearest hospital. For other signs that concern you, call your health care provider. Adverse reactions should be reported to the Vaccine Adverse Event Reporting System (VAERS). Your health care provider will usually file this report, or you can do it yourself. Visit the VAERS website at www.vaers.LAgents.nohhs.gov or call 534-549-31791-415 785 1593. VAERS is only for reporting reactions, and VAERS staff do not give medical advice. 6. How can I learn more?  Ask your health care provider.  Call your local or state health department.  Contact the Centers for Disease Control and Prevention (CDC): ? Call (248) 823-26561-660-644-6984 (1-800-CDC-INFO) or ? Visit CDC's website at PicCapture.uywww.cdc.gov/vaccines Vaccine Information Statement Recombinant Zoster Vaccine (08/19/2018) This information is not intended to replace advice given to you by your health care provider. Make sure you discuss any questions you have with your health care provider. Document Released: 12/17/2016 Document Revised: 01/26/2019 Document Reviewed: 05/13/2018 Elsevier Patient Education  2020 [employer] https://www.cdc.gov/vaccines/hcp/vis/vis-statements/tdap.pdf">  Tdap Vaccine (Tetanus, Diphtheria and Pertussis): What You Need to Know 1. Why get vaccinated? Tetanus, diphtheria and pertussis are very serious diseases. Tdap vaccine can protect us from these diseases. And, Tdap vaccine given to pregnant women can protect newborn babies against pertussis.Marland Kitchen. TETANUS (Lockjaw) is rare in the [place] today. It causes painful muscle tightening and stiffness, usually all over the body.  It can lead to tightening of muscles in the head and neck so you can't open your mouth, swallow, or  sometimes even breathe. Tetanus kills about 1 out of 10 people who are infected even after receiving the best medical care. DIPHTHERIA is also rare in the [place] today. It can cause a thick coating to form in the back of the throat.  It can lead to breathing problems, heart failure, paralysis, and death. PERTUSSIS (Whooping Cough) causes severe coughing spells, which can cause difficulty breathing, vomiting and disturbed sleep.  It can also lead to weight loss, incontinence, and rib fractures. Up to 2 in 100 adolescents and 5 in 100 adults with pertussis are hospitalized or have complications, which could include pneumonia or death. These diseases are caused by bacteria. Diphtheria and pertussis are spread from person to person through secretions from coughing or sneezing. Tetanus enters the body through cuts, scratches, or wounds. Before vaccines, as many as 200,000 cases of diphtheria, 200,000 cases of pertussis, and hundreds of cases of tetanus, were reported in the [place] each year. Since vaccination began, reports of cases for tetanus and diphtheria have dropped by about 99% and for pertussis by about 80%. 2. Tdap vaccine Tdap vaccine can protect adolescents and adults from tetanus, diphtheria, and pertussis. One dose of Tdap is routinely given at age 111 or 4112. People who did not get Tdap at that age should get it as soon as possible. Tdap is especially important for healthcare professionals and anyone having close contact with a baby younger than 12 months. Pregnant women should get a dose of Tdap during every pregnancy, to protect the newborn from pertussis. Infants are most at risk for severe, life-threatening complications from pertussis. Another vaccine, called Td, protects against tetanus and diphtheria,  but not pertussis. A Td booster should be given every 10 years. Tdap may be given as one of these boosters if you have never gotten Tdap before. Tdap may also be given after a  severe cut or burn to prevent tetanus infection. Your doctor or the person giving you the vaccine can give you more information. Tdap may safely be given at the same time as other vaccines. 3. Some people should not get this vaccine  A person who has ever had a life-threatening allergic reaction after a previous dose of any diphtheria, tetanus or pertussis containing vaccine, OR has a severe allergy to any part of this vaccine, should not get Tdap vaccine. Tell the person giving the vaccine about any severe allergies.  Anyone who had coma or long repeated seizures within 7 days after a childhood dose of DTP or DTaP, or a previous dose of Tdap, should not get Tdap, unless a cause other than the vaccine was found. They can still get Td.  Talk to your doctor if you: ? have seizures or another nervous system problem, ? had severe pain or swelling after any vaccine containing diphtheria, tetanus or pertussis, ? ever had a condition called Guillain-Barr Syndrome (GBS), ? aren't feeling well on the day the shot is scheduled. 4. Risks With any medicine, including vaccines, there is a chance of side effects. These are usually mild and go away on their own. Serious reactions are also possible but are rare. Most people who get Tdap vaccine do not have any problems with it. Mild problems following Tdap (Did not interfere with activities)  Pain where the shot was given (about 3 in 4 adolescents or 2 in 3 adults)  Redness or swelling where the shot was given (about 1 person in 5)  Mild fever of at least 100.34F (up to about 1 in 25 adolescents or 1 in 100 adults)  Headache (about 3 or 4 people in 10)  Tiredness (about 1 person in 3 or 4)  Nausea, vomiting, diarrhea, stomach ache (up to 1 in 4 adolescents or 1 in 10 adults)  Chills, sore joints (about 1 person in 10)  Body aches (about 1 person in 3 or 4)  Rash, swollen glands (uncommon) Moderate problems following Tdap (Interfered with  activities, but did not require medical attention)  Pain where the shot was given (up to 1 in 5 or 6)  Redness or swelling where the shot was given (up to about 1 in 16 adolescents or 1 in 12 adults)  Fever over 102F (about 1 in 100 adolescents or 1 in 250 adults)  Headache (about 1 in 7 adolescents or 1 in 10 adults)  Nausea, vomiting, diarrhea, stomach ache (up to 1 or 3 people in 100)  Swelling of the entire arm where the shot was given (up to about 1 in 500). Severe problems following Tdap (Unable to perform usual activities; required medical attention)  Swelling, severe pain, bleeding and redness in the arm where the shot was given (rare). Problems that could happen after any vaccine:  People sometimes faint after a medical procedure, including vaccination. Sitting or lying down for about 15 minutes can help prevent fainting, and injuries caused by a fall. Tell your doctor if you feel dizzy, or have vision changes or ringing in the ears.  Some people get severe pain in the shoulder and have difficulty moving the arm where a shot was given. This happens very rarely.  Any medication can cause a severe allergic  reaction. Such reactions from a vaccine are very rare, estimated at fewer than 1 in a million doses, and would happen within a few minutes to a few hours after the vaccination. As with any medicine, there is a very remote chance of a vaccine causing a serious injury or death. The safety of vaccines is always being monitored. For more information, visit: http://floyd.org/ 5. What if there is a serious problem? What should I look for?  Look for anything that concerns you, such as signs of a severe allergic reaction, very high fever, or unusual behavior. Signs of a severe allergic reaction can include hives, swelling of the face and throat, difficulty breathing, a fast heartbeat, dizziness, and weakness. These would usually start a few minutes to a few hours after the  vaccination. What should I do?  If you think it is a severe allergic reaction or other emergency that can't wait, call 9-1-1 or get the person to the nearest hospital. Otherwise, call your doctor.  Afterward, the reaction should be reported to the Vaccine Adverse Event Reporting System (VAERS). Your doctor might file this report, or you can do it yourself through the VAERS web site at www.vaers.LAgents.no, or by calling 1-619 809 5082. VAERS does not give medical advice. 6. The National Vaccine Injury Compensation Program The [employer] Vaccine Injury Compensation Program (VICP) is a federal program that was created to compensate people who may have been injured by certain vaccines. Persons who believe they may have been injured by a vaccine can learn about the program and about filing a claim by calling 1-7800765622 or visiting the VICP website at SpiritualWord.at. There is a time limit to file a claim for compensation. 7. How can I learn more?  Ask your doctor. He or she can give you the vaccine package insert or suggest other sources of information.  Call your local or state health department.  Contact the Centers for Disease Control and Prevention (CDC): ? Call 6696969528 (1-800-CDC-INFO) or ? Visit CDC's website at PicCapture.uy Vaccine Information Statement Tdap Vaccine (12/14/2013) This information is not intended to replace advice given to you by your health care provider. Make sure you discuss any questions you have with your health care provider. Document Released: 04/07/2012 Document Revised: 05/25/2018 Document Reviewed: 05/25/2018 Elsevier Interactive Patient Education  [employer].

## 2019-08-24 LAB — RHEUMATOID FACTOR: Rheumatoid fact SerPl-aCnc: <14 IU/mL (ref <14)

## 2019-08-24 LAB — ANA: Anti Nuclear Antibody (ANA): NEGATIVE

## 2019-09-01 ENCOUNTER — Encounter: Payer: Self-pay | Admitting: Family Medicine

## 2019-09-01 ENCOUNTER — Other Ambulatory Visit: Payer: Self-pay

## 2019-09-01 ENCOUNTER — Telehealth (INDEPENDENT_AMBULATORY_CARE_PROVIDER_SITE_OTHER): Payer: BC Managed Care – PPO | Admitting: Family Medicine

## 2019-09-01 DIAGNOSIS — I10 Essential (primary) hypertension: Secondary | ICD-10-CM

## 2019-09-01 DIAGNOSIS — E790 Hyperuricemia without signs of inflammatory arthritis and tophaceous disease: Secondary | ICD-10-CM

## 2019-09-01 DIAGNOSIS — E782 Mixed hyperlipidemia: Secondary | ICD-10-CM

## 2019-09-01 DIAGNOSIS — E034 Atrophy of thyroid (acquired): Secondary | ICD-10-CM | POA: Diagnosis not present

## 2019-09-01 NOTE — Progress Notes (Signed)
Virtual Visit via Telephone Note  I connected with Stacy Hart  on 09/01/19 at  2:00 PM EST by telephone and verified that I am speaking with the correct person using two identifiers.   I discussed the limitations, risks, security and privacy concerns of performing an evaluation and management service by telephone and the availability of in person appointments. I also discussed with the patient that there may be a patient responsible charge related to this service. The patient expressed understanding and agreed to proceed.  Location patient: home Location provider: work office Participants present for the call: patient, provider Patient did not have a visit in the prior 7 days to address this/these issue(s).   History of Present Illness: Feeling really well today. 149/85 before meds today - blood pressure.   Patient asked to schedule follow up visit for lab results review; multiple concerns that she had at last visit as well as some lab abnormalities that we needed to review. See lab notes below:  *Her low functioning thyroid is still extremely under replaced. I know that the thyroid medication does not make her feel well, but we need to find something to treat this because it can cause long-term effects including issues with memory, fatigue, and even affect the heart. I would suggest we either try different sort of thyroid replacement called Armour Thyroid. Some people tolerate this better than the Synthroid. Or, we started her on a slightly lower dose, which she may tolerate better, that she takes every single day. Please let me know how she would like to proceed. I would like to recheck in 6 to 8 weeks regardless. She is taking just a couple of times/week. We talked through and she is going to take a half tab daily. Seems to make her gassy.   *Please see how blood pressures are running at home? We will need to adjust medication if still running high. We can further discuss  switching to a different medication if she would like since she feels that hers makes her dizzy. Might suggest amlodipine, which I feel is sometimes easier to tolerate than diuretics. I could set up a phone visit to discuss if she would like. Patient would like to take a whole tablet in the morning instead of half in morning and evening - feels like it caused her to have some heartburn. Has been only taking half tablet in the morning. Thinks taking right before bed and lying down was the issue. Sometimes would even throw up. She didn't feel sick in morning with taking medication. Taking prilosec daily also really helped too.   *Blood counts are normal, blood sugar is normal, liver, kidneys, electrolytes, inflammatory markers are all negative.   *Uric acid levels are still elevated. Some of her arthritic aches and pains could be related to this. We could do trial of allopurinol, but I feel like treating her thyroid first is more important since she has not had any recent gout flares that she knows of. Has had allopurinol in the past. Gout does run in family. All siblings have had this. Felt like 10 years ago the allopurinol was helpful. Doesn't feel like it is helpful anymore. advil seems to help. Pain seems to come more at night.   *finally daily exercise to help boost HDL is important.   Observations/Objective: Patient sounds cheerful and well on the phone. I do not appreciate any SOB. Speech and thought processing are grossly intact. Patient reported vitals:  Assessment and Plan:  1.  Hypothyroidism due to acquired atrophy of thyroid She is going to work on taking the synthroid half tablet ( ) daily and see if she tolerates this better.  - TSH; Future  2. Mixed hyperlipidemia Has been diet controlled.  3. Essential hypertension suboptimal control. She was only taking 1/2 tab in morning. She is going to increase to a full tablet in morning and we will have her recheck in office in 2  months.   4. Elevated uric acid in blood Not wanting to start allopurinol. We discussed that this could also be related to blood pressure medication, but she is not wanting to change medication at this point.    Follow Up Instructions:  Return in about 2 months (around 11/01/2019).   [id number] [id number] [id number] 11-20 [id number] 21-30 I did not refer this patient for an OV in the next 24 hours for this/these issue(s).  I discussed the assessment and treatment plan with the patient. The patient was provided an opportunity to ask questions and all were answered. The patient agreed with the plan and demonstrated an understanding of the instructions.   The patient was advised to call back or seek an in-person evaluation if the symptoms worsen or if the condition fails to improve as anticipated.  I provided 28 minutes of non-face-to-face time during this encounter.   Theodis Shove, MD

## 2019-09-07 ENCOUNTER — Telehealth: Payer: Self-pay | Admitting: *Deleted

## 2019-09-07 NOTE — Telephone Encounter (Signed)
-----   Message from Caren Macadam, MD sent at 09/01/2019  2:31 PM EST ----- Please set up follow up lab visit in [place]  and then followed by physical in office.

## 2019-09-07 NOTE — Telephone Encounter (Signed)
Appts scheduled as below.  

## 2019-11-01 ENCOUNTER — Other Ambulatory Visit: Payer: BC Managed Care – PPO

## 2019-11-10 ENCOUNTER — Ambulatory Visit: Payer: BC Managed Care – PPO | Admitting: Family Medicine

## 2019-12-10 ENCOUNTER — Other Ambulatory Visit: Payer: Self-pay

## 2019-12-13 ENCOUNTER — Other Ambulatory Visit: Payer: BC Managed Care – PPO

## 2019-12-22 ENCOUNTER — Ambulatory Visit: Payer: BC Managed Care – PPO | Admitting: Family Medicine

## 2019-12-28 ENCOUNTER — Other Ambulatory Visit: Payer: Self-pay

## 2019-12-28 ENCOUNTER — Other Ambulatory Visit (INDEPENDENT_AMBULATORY_CARE_PROVIDER_SITE_OTHER): Payer: BC Managed Care – PPO

## 2019-12-28 DIAGNOSIS — E034 Atrophy of thyroid (acquired): Secondary | ICD-10-CM | POA: Diagnosis not present

## 2019-12-28 LAB — TSH: TSH: 38.96 u[IU]/mL — ABNORMAL HIGH (ref 0.35–4.50)

## 2019-12-30 ENCOUNTER — Other Ambulatory Visit: Payer: Self-pay

## 2019-12-30 NOTE — Progress Notes (Signed)
Stacy Hart DOB: 1956-01-25 Encounter date: 12/31/2019  This is a 64 y.o. female who presents for complete physical   History of present illness/Additional concerns: Both daughters live with her. They enjoy having them at home.   All of her medications are expired, except cholestyramine.   Went through period where she was having panic attacks. Stopped when she started estradiol. Had xanax on hand just for when needed. Rarely takes; still has rx  Left from 04/2018.   HTN: blood pressures at home: 137/86, 139/88, 138/86, 136/84. States pretty good at taking on daily basis. Started to crochet and feels that this helps relax/helps with blood pressure.   Hypothyroid:TSH down to 38 from 41 previously. Noted that med was expired. Wondered if it made difference for thyroid control. Seems to do better with the half tab of daily. Takes imodium to help with diarrhea that she feels is a side effect of the synthroid. Feels better with 1/2 tab, but sleep is affected. Only getting about 4 hours of sleep. Has trouble staying asleep.   GERD: prilosec is OTC; helps with sx and does take daily. This along with bulk helps with loose stools.   ?previously declined colonoscopy repeat due to prep, cologuard ordered 04/2018 but not completed: Cost to her was $800. She would like to defer to May and will call back and let us know.   Mammogram: last was 04/2018 normal; she is due for repeat.  Number given for her to call and schedule.  No immunization history? Declines all immunizations although considers shingles.  Will think about this in the future.  [facility] pap 06/2014 normal. Declines further.  Rather than switch medications she would like to try to get medicines refilled since she has noted that hers are expired.  Past Medical History:  Diagnosis Date  . Acquired keratoderma   . Allergy   . Anxiety   . Arthritis   . Benign paroxysmal positional vertigo   . Chicken pox    childhood  . Cleft palate   . Esophageal reflux   . Gallstones   . GERD (gastroesophageal reflux disease)   . Gout, unspecified   . Measles    childhood  . Mixed hyperlipidemia   . Other specified conditions influencing health status(V49.89)   . Pain in limb   . Personal history of other diseases of circulatory system   . SVT (supraventricular tachycardia) (HCC)   . Symptomatic menopausal or female climacteric states   . Unspecified asthma(493.90)   . Unspecified essential hypertension   . Unspecified hypothyroidism   . Unspecified vitamin D deficiency   . Vitreous detachment of right eye 03/21/2014   [name]   Past Surgical History:  Procedure Laterality Date  . ABDOMINAL HYSTERECTOMY  2001   Ovaries intact, DUB, uterine fibroids  . COSMETIC SURGERY    . FOOT SURGERY  2009   Left   Morton's Neuroma  . GALLBLADDER SURGERY  1998  . Heart ablation  1996   SVT  . OOPHORECTOMY  2014  . surgeries for cleft palatte and lips     [facility]  . TUBAL LIGATION     Allergies  Allergen Reactions  . Epinephrine     "interferes with heart"  . Sulfa Antibiotics   . Ceftin [Cefuroxime Axetil] Rash   Current Meds  Medication Sig  . ALPRAZolam (XANAX) 0.25 MG tablet Take 1 tablet (0.25 mg total) by mouth at bedtime as needed for anxiety.  . cholestyramine Lanetta Inch)  4 g packet USE 1 PACKET TWICE A DAY AS DIRECTED  . estradiol (ESTRACE) 2 MG tablet Take 1 tablet (2 mg total) by mouth daily.  Marland Kitchen levothyroxine (SYNTHROID) 88 MCG tablet Take 0.5-1 tablets (44-88 mcg total) by mouth daily before breakfast.  . loperamide (IMODIUM A-D) 2 MG tablet Take 2 mg by mouth daily.  Marland Kitchen omeprazole (PRILOSEC) 20 MG capsule Take 1 capsule (20 mg total) by mouth daily.  Marland Kitchen triamterene-hydrochlorothiazide (MAXZIDE-25) 37.5-25 MG tablet Take 1.5 tablets by mouth daily.  . [DISCONTINUED] ALPRAZolam (XANAX) 0.25 MG tablet Take 1 tablet (0.25 mg total) by mouth at bedtime as needed for anxiety.  . [DISCONTINUED]  estradiol (ESTRACE) 2 MG tablet Take 1 tablet (2 mg total) by mouth daily.  . [DISCONTINUED] levothyroxine (SYNTHROID) 88 MCG tablet Take 1 tablet (88 mcg total) by mouth daily before breakfast. BRAND NAME SYNTHROID. (Patient taking differently: Take 44 mcg by mouth daily before breakfast. BRAND NAME SYNTHROID.)  . [DISCONTINUED] triamterene-hydrochlorothiazide (MAXZIDE-25) 37.5-25 MG tablet Take 1 tablet by mouth daily.   Social History   Tobacco Use  . Smoking status: Former Smoker    Packs/day: 2.00    Years: 7.00    Pack years: 14.00    Types: Cigarettes    Quit date: 07/23/1983    Years since quitting: 36.4  . Smokeless tobacco: Never Used  Substance Use Topics  . Alcohol use: Yes    Comment: occasonal once a week   Family History  Problem Relation Age of Onset  . Heart disease Mother   . Stroke Mother   . Depression Mother   . Diabetes Mother   . Cancer Mother 5       tumor behind eye  . Heart disease Father   . Hypothyroidism Father   . Other Father        dvt; on coumadin  . Diabetes Sister   . Heart disease Sister   . Diabetes Brother   . Pulmonary embolism Brother   . Deep vein thrombosis Brother   . Stroke Brother        CVA  . Colon cancer Brother   . Hypertension Sister   . Depression Sister   . Hyperlipidemia Sister   . Anxiety disorder Sister   . Colon cancer Sister 46       mets, may have been liver  . Celiac disease Daughter      Review of Systems  Constitutional: Negative for activity change, appetite change, chills, fatigue, fever and unexpected weight change.  HENT: Negative for congestion, ear pain, hearing loss, sinus pressure, sinus pain, sore throat and trouble swallowing.   Eyes: Negative for pain and visual disturbance.  Respiratory: Negative for cough, chest tightness, shortness of breath and wheezing.   Cardiovascular: Negative for chest pain, palpitations and leg swelling.  Gastrointestinal: Negative for abdominal pain, blood in  stool, constipation, diarrhea, nausea and vomiting.  Genitourinary: Negative for difficulty urinating and menstrual problem.  Musculoskeletal: Negative for arthralgias and back pain.  Skin: Negative for rash.  Neurological: Negative for dizziness, weakness, numbness and headaches.  Hematological: Negative for adenopathy. Does not bruise/bleed easily.  Psychiatric/Behavioral: Negative for sleep disturbance and suicidal ideas. The patient is not nervous/anxious.     CBC:  Lab Results  Component Value Date   WBC 10.0 08/23/2019   HGB 14.9 08/23/2019   HGB 15.6 04/29/2018   HCT 43.8 08/23/2019   HCT 47.1 (H) 04/29/2018   MCH 30.3 04/29/2018   MCH 31.3 09/03/2016  MCHC 34.0 08/23/2019   RDW 12.9 08/23/2019   RDW 13.1 04/29/2018   PLT 197.0 08/23/2019   PLT 230 04/29/2018   MPV 11.0 09/03/2016   MPV 8.6 05/04/2016   CMP: Lab Results  Component Value Date   NA 139 08/23/2019   NA 143 04/29/2018   K 3.7 08/23/2019   CL 100 08/23/2019   CO2 29 08/23/2019   GLUCOSE 93 08/23/2019   BUN 15 08/23/2019   BUN 14 04/29/2018   CREATININE 0.64 08/23/2019   CREATININE 0.61 09/03/2016   LABGLOB 3.2 04/29/2018   GFRAA 105 04/29/2018   GFRAA >89 07/20/2014   CALCIUM 9.4 08/23/2019   PROT 7.3 08/23/2019   PROT 8.0 04/29/2018   AGRATIO 1.5 04/29/2018   BILITOT 0.5 08/23/2019   BILITOT 0.5 04/29/2018   ALKPHOS 44 08/23/2019   ALT 19 08/23/2019   AST 18 08/23/2019   LIPID: Lab Results  Component Value Date   CHOL 172 08/23/2019   CHOL 189 04/29/2018   TRIG 318.0 (H) 08/23/2019   HDL 35.80 (L) 08/23/2019   HDL 36 (L) 04/29/2018   LDLCALC Comment 04/29/2018   LABVLDL Comment 04/29/2018    Objective:  BP (!) 170/100 (BP Location: Left Arm, Patient Position: Sitting, Cuff Size: Normal)   Pulse 73   Temp 97.7 F (36.5 C) (Temporal)   Ht 5\' 4"  (1.626 m)   Wt 152 lb 6.4 oz (69.1 kg)   SpO2 97%   BMI 26.16 kg/m   Weight: 152 lb 6.4 oz (69.1 kg)   BP Readings from Last 3  Encounters:  12/31/19 (!) 170/100  08/23/19 (!) 146/92  04/29/18 (!) 142/82   Wt Readings from Last 3 Encounters:  12/31/19 152 lb 6.4 oz (69.1 kg)  08/23/19 152 lb 9.6 oz (69.2 kg)  04/29/18 148 lb (67.1 kg)    Physical Exam Constitutional:      General: She is not in acute distress.    Appearance: She is well-developed.  HENT:     Head: Normocephalic and atraumatic.     Right Ear: External ear normal.     Left Ear: External ear normal.     Mouth/Throat:     Pharynx: No oropharyngeal exudate.  Eyes:     Conjunctiva/sclera: Conjunctivae normal.     Pupils: Pupils are equal, round, and reactive to light.  Neck:     Thyroid: No thyromegaly.  Cardiovascular:     Rate and Rhythm: Normal rate and regular rhythm.     Heart sounds: Normal heart sounds. No murmur. No friction rub. No gallop.   Pulmonary:     Effort: Pulmonary effort is normal.     Breath sounds: Normal breath sounds.  Abdominal:     General: Bowel sounds are normal. There is no distension.     Palpations: Abdomen is soft. There is no mass.     Tenderness: There is no abdominal tenderness. There is no guarding.     Hernia: No hernia is present.  Musculoskeletal:        General: No tenderness or deformity. Normal range of motion.     Cervical back: Normal range of motion and neck supple.  Lymphadenopathy:     Cervical: No cervical adenopathy.  Skin:    General: Skin is warm and dry.     Findings: No rash.  Neurological:     Mental Status: She is alert and oriented to person, place, and time.     Deep Tendon Reflexes: Reflexes normal.  Reflex Scores:      Tricep reflexes are 2+ on the right side and 2+ on the left side.      Bicep reflexes are 2+ on the right side and 2+ on the left side.      Brachioradialis reflexes are 2+ on the right side and 2+ on the left side.      Patellar reflexes are 2+ on the right side and 2+ on the left side. Psychiatric:        Speech: Speech normal.        Behavior:  Behavior normal.        Thought Content: Thought content normal.     Assessment/Plan: Health Maintenance Due  Topic Date Due  . HIV Screening  Never done   Health Maintenance reviewed -she will consider Cologuard once insurance changes over.  She states that she will call to schedule mammogram (referral put in today).  She declines preventative immunizations, although will consider Shingrix.  1. Preventative health care Discussed importance of healthy eating and regular exercise.  Discussed all the health maintenance items above.  2. Hypothyroidism due to acquired atrophy of thyroid She would like to try to take nonexpired medication before changing dose.  She is doing better with 1/2 tablet of the Synthroid.  She would like to try the nonnamebrand this time.  She does not wish to try the Armour Thyroid after discussion of treatment options.  She does not wish to see endocrinology at this time.  We will recheck thyroid enzymes in 6 weeks time. - levothyroxine (SYNTHROID) 88 MCG tablet; Take 0.5-1 tablets (44-88 mcg total) by mouth daily before breakfast.  Dispense: 90 tablet; Refill: 3 - TSH; Future - T4, free; Future  3. Mixed hyperlipidemia Cholesterol is well controlled.  Continue with healthy diet.  4. Essential hypertension Blood pressures not well controlled.  She has not done well with medication adjustments or new medications in the past for blood pressure.  She is agreeable to increasing current Maxide dose to 1-1/2 tablets daily.  She will update me with blood pressure readings in 2 weeks time so we can determine if further adjustment is needed.  5. SVT (supraventricular tachycardia) (HCC) Historical.  She has not been symptomatic with this recently.  6. Elevated uric acid in blood We will continue to monitor.  7. Encounter for screening mammogram for malignant neoplasm of breast - MM DIGITAL SCREENING BILATERAL; Future  8. Anxiety Generally well controlled.  Rare use  of Xanax.  Prescription refill since expired. - ALPRAZolam (XANAX) 0.25 MG tablet; Take 1 tablet (0.25 mg total) by mouth at bedtime as needed for anxiety.  Dispense: 20 tablet; Refill: 0  9. Insomnia, unspecified type See above.  Rarely uses for insomnia at bedtime. - ALPRAZolam (XANAX) 0.25 MG tablet; Take 1 tablet (0.25 mg total) by mouth at bedtime as needed for anxiety.  Dispense: 20 tablet; Refill: 0  10. Estrogen deficiency Does not take this daily.  Limits dose when possible. - estradiol (ESTRACE) 2 MG tablet; Take 1 tablet (2 mg total) by mouth daily.  Dispense: 90 tablet; Refill: 1  11. Recurrent sinusitis Increased risk of infection due to cleft palate repair.  Keeps amoxicillin on hand in case symptoms start. - amoxicillin (AMOXIL) 500 MG capsule; Take 2 capsules (1,000 mg total) by mouth 2 (two) times daily for 10 days.  Dispense: 40 capsule; Refill: 1   Return for lab visit in 6 weeks time followed by [facility] - 30 min.  Micheline Rough, MD

## 2019-12-31 ENCOUNTER — Ambulatory Visit (INDEPENDENT_AMBULATORY_CARE_PROVIDER_SITE_OTHER): Payer: BC Managed Care – PPO | Admitting: Family Medicine

## 2019-12-31 ENCOUNTER — Encounter: Payer: Self-pay | Admitting: Family Medicine

## 2019-12-31 VITALS — BP 170/100 | HR 73 | Temp 97.7°F | Ht 64.0 in | Wt 152.4 lb

## 2019-12-31 DIAGNOSIS — E034 Atrophy of thyroid (acquired): Secondary | ICD-10-CM | POA: Diagnosis not present

## 2019-12-31 DIAGNOSIS — E790 Hyperuricemia without signs of inflammatory arthritis and tophaceous disease: Secondary | ICD-10-CM

## 2019-12-31 DIAGNOSIS — I10 Essential (primary) hypertension: Secondary | ICD-10-CM | POA: Diagnosis not present

## 2019-12-31 DIAGNOSIS — E782 Mixed hyperlipidemia: Secondary | ICD-10-CM | POA: Diagnosis not present

## 2019-12-31 DIAGNOSIS — Z0000 Encounter for general adult medical examination without abnormal findings: Secondary | ICD-10-CM | POA: Diagnosis not present

## 2019-12-31 DIAGNOSIS — Z1231 Encounter for screening mammogram for malignant neoplasm of breast: Secondary | ICD-10-CM

## 2019-12-31 DIAGNOSIS — E2839 Other primary ovarian failure: Secondary | ICD-10-CM

## 2019-12-31 DIAGNOSIS — J329 Chronic sinusitis, unspecified: Secondary | ICD-10-CM

## 2019-12-31 DIAGNOSIS — I471 Supraventricular tachycardia, unspecified: Secondary | ICD-10-CM

## 2019-12-31 DIAGNOSIS — F419 Anxiety disorder, unspecified: Secondary | ICD-10-CM

## 2019-12-31 DIAGNOSIS — G4700 Insomnia, unspecified: Secondary | ICD-10-CM

## 2019-12-31 MED ORDER — AMOXICILLIN 500 MG PO CAPS: 1000.0000 mg | ORAL_CAPSULE | Freq: Two times a day (BID) | ORAL | 1 refills | Status: AC

## 2019-12-31 MED ORDER — LEVOTHYROXINE SODIUM 88 MCG PO TABS: 44.0000 ug | ORAL_TABLET | Freq: Every day | ORAL | 3 refills | Status: DC

## 2019-12-31 MED ORDER — ALPRAZOLAM 0.25 MG PO TABS: 0.2500 mg | ORAL_TABLET | Freq: Every evening | ORAL | 0 refills | Status: DC | PRN

## 2019-12-31 MED ORDER — ESTRADIOL 2 MG PO TABS: 2.0000 mg | ORAL_TABLET | Freq: Every day | ORAL | 1 refills | Status: DC

## 2019-12-31 MED ORDER — TRIAMTERENE-HCTZ 37.5-25 MG PO TABS: 1.5000 | ORAL_TABLET | Freq: Every day | ORAL | 1 refills | Status: DC

## 2020-02-15 ENCOUNTER — Other Ambulatory Visit: Payer: Self-pay

## 2020-02-16 ENCOUNTER — Other Ambulatory Visit (INDEPENDENT_AMBULATORY_CARE_PROVIDER_SITE_OTHER): Payer: BC Managed Care – PPO

## 2020-02-16 DIAGNOSIS — E034 Atrophy of thyroid (acquired): Secondary | ICD-10-CM | POA: Diagnosis not present

## 2020-02-16 LAB — T4, FREE: Free T4: 0.49 ng/dL — ABNORMAL LOW (ref 0.60–1.60)

## 2020-02-16 LAB — TSH: TSH: 36.94 u[IU]/mL — ABNORMAL HIGH (ref 0.35–4.50)

## 2020-02-23 ENCOUNTER — Other Ambulatory Visit: Payer: Self-pay

## 2020-02-23 ENCOUNTER — Telehealth (INDEPENDENT_AMBULATORY_CARE_PROVIDER_SITE_OTHER): Payer: BC Managed Care – PPO | Admitting: Family Medicine

## 2020-02-23 ENCOUNTER — Encounter: Payer: Self-pay | Admitting: Family Medicine

## 2020-02-23 DIAGNOSIS — E034 Atrophy of thyroid (acquired): Secondary | ICD-10-CM | POA: Diagnosis not present

## 2020-02-23 DIAGNOSIS — E782 Mixed hyperlipidemia: Secondary | ICD-10-CM

## 2020-02-23 DIAGNOSIS — M1A00X0 Idiopathic chronic gout, unspecified site, without tophus (tophi): Secondary | ICD-10-CM

## 2020-02-23 DIAGNOSIS — I10 Essential (primary) hypertension: Secondary | ICD-10-CM | POA: Diagnosis not present

## 2020-02-23 MED ORDER — CHOLESTYRAMINE 4 G PO PACK: PACK | ORAL | 3 refills | Status: DC

## 2020-02-23 MED ORDER — TRIAMTERENE-HCTZ 37.5-25 MG PO TABS: 1.0000 | ORAL_TABLET | Freq: Every day | ORAL | 1 refills | Status: DC

## 2020-02-23 MED ORDER — SYNTHROID 88 MCG PO TABS: 88.0000 ug | ORAL_TABLET | Freq: Every day | ORAL | 1 refills | Status: DC

## 2020-02-23 NOTE — Progress Notes (Signed)
Virtual Visit via Telephone Note  I connected with Stacy Hart on 02/23/20 at  1:00 PM EDT by telephone and verified that I am speaking with the correct person using two identifiers.   I discussed the limitations, risks, security and privacy concerns of performing an evaluation and management service by telephone and the availability of in person appointments. I also discussed with the patient that there may be a patient responsible charge related to this service. The patient expressed understanding and agreed to proceed.  Location patient: home Location provider: work office Participants present for the call: patient, provider Patient did not have a visit in the prior 7 days to address this/these issue(s).    Stacy Hart DOB: 20-Feb-1956 Encounter date: 02/23/2020  This is a 64 y.o. female who presents with Chief Complaint  Patient presents with  . Follow-up  . Foot Injury    patient states there is a knot present on the left foot she injured 2 weeks after her shoe slipped and she fell on concrete    History of present illness: Feeling really well.   Did start to take a whole thyroid pill for about a week before leaving for vacation.   Blood pressure has come down. Quit salting her food and this helped with her blood pressure (last reading was last week): last reading was 142/82. 138/82. She has only been taking 1/2 of the maxzide (was written for her to take 1.5)  [place] a couple of weeks ago - slipped off steps in sandals. Got big knot on side of foot; can turn and move it. Taped it to help with healing. Still limping, but is better, bruising going away although still present on toes. She is able to stand on foot/ankle ok. Was able to walk around and walk on beach over this past week.    Allergies  Allergen Reactions  . Epinephrine     "interferes with heart"  . Sulfa Antibiotics   . Ceftin [Cefuroxime Axetil] Rash   Current Meds  Medication Sig  . ALPRAZolam  (XANAX) 0.25 MG tablet Take 1 tablet (0.25 mg total) by mouth at bedtime as needed for anxiety.  . cholestyramine (QUESTRAN) 4 g packet USE 1 PACKET TWICE A DAY AS DIRECTED  . estradiol (ESTRACE) 2 MG tablet Take 1 tablet (2 mg total) by mouth daily.  Marland Kitchen levothyroxine (SYNTHROID) 88 MCG tablet Take 0.5-1 tablets (44-88 mcg total) by mouth daily before breakfast. (Patient taking differently: Take 88 mcg by mouth daily before breakfast. )  . loperamide (IMODIUM A-D) 2 MG tablet Take 2 mg by mouth daily.  Marland Kitchen omeprazole (PRILOSEC) 20 MG capsule Take 1 capsule (20 mg total) by mouth daily.  Marland Kitchen triamterene-hydrochlorothiazide (MAXZIDE-25) 37.5-25 MG tablet Take 1.5 tablets by mouth daily.    Review of Systems  Constitutional: Negative for chills, fatigue and fever.  Respiratory: Negative for cough, chest tightness, shortness of breath and wheezing.   Cardiovascular: Negative for chest pain, palpitations and leg swelling.  Musculoskeletal: Positive for arthralgias and gait problem.     Observations/Objective: Patient sounds cheerful and well on the phone. I do not appreciate any SOB. Speech and thought processing are grossly intact. Patient reported vitals:  Assessment and Plan:   1. Essential hypertension States that her blood pressure is doing better. She has not been compliant with taking medication as prescribed, but plans to try to take a full tablet of Maxide and continue to monitor. - triamterene-hydrochlorothiazide (MAXZIDE-25) 37.5-25 MG tablet; Take 1  tablet by mouth daily.  Dispense: 90 tablet; Refill: 1 - Comprehensive metabolic panel; Future - CBC with Differential/Platelet; Future  2. Hypothyroidism due to acquired atrophy of thyroid She does not want to see endocrinology. She does not want to take a different dose of Synthroid. She is willing to continue with the full tablet of Synthroid (which she has only done for 1 week's time) and plan to recheck labs. We have discussed  complications of hypothyroidism in detail, and discussed that if blood work is not better with next recheck, or if not tolerating increase in thyroid, she really needs to follow-up with endocrinology. She has had difficulty tolerating any increase in Synthroid in the past. She does not want to take Armour Thyroid. I have discussed with her that without willingness to adjust Synthroid dose or try Armour Thyroid, seeing endocrinology is the best next step. - SYNTHROID 88 MCG tablet; Take 1 tablet (88 mcg total) by mouth daily before breakfast.  Dispense: 90 tablet; Refill: 1 - TSH; Future - T4, free; Future  3. Mixed hyperlipidemia - cholestyramine (QUESTRAN) 4 g packet; USE 1 PACKET TWICE A DAY AS DIRECTED  Dispense: 180 packet; Refill: 3 - Lipid panel; Future  4. Idiopathic chronic gout without tophus, unspecified site - Uric acid; Future   I did not refer this patient for an OV in the next 24 hours for this/these issue(s).  I discussed the assessment and treatment plan with the patient. The patient was provided an opportunity to ask questions and all were answered. The patient agreed with the plan and demonstrated an understanding of the instructions.   The patient was advised to call back or seek an in-person evaluation if the symptoms worsen or if the condition fails to improve as anticipated.  I provided 30 minutes of non-face-to-face time during this encounter.  Theodis Shove, MD

## 2020-02-24 ENCOUNTER — Telehealth: Payer: Self-pay | Admitting: *Deleted

## 2020-02-24 NOTE — Telephone Encounter (Signed)
-----   Message from Wynn Banker, MD sent at 02/23/2020  1:33 PM EDT ----- Needs bloodwork appointment in 8 weeks; I would like follow up appointment after that.

## 2020-02-24 NOTE — Telephone Encounter (Signed)
Patient stated she will check her schedule and call back for appts as below.

## 2020-05-30 DIAGNOSIS — Z0120 Encounter for dental examination and cleaning without abnormal findings: Secondary | ICD-10-CM | POA: Diagnosis not present

## 2020-09-18 ENCOUNTER — Other Ambulatory Visit: Payer: Self-pay | Admitting: Family Medicine

## 2020-09-18 DIAGNOSIS — E2839 Other primary ovarian failure: Secondary | ICD-10-CM

## 2021-01-19 ENCOUNTER — Other Ambulatory Visit: Payer: Self-pay | Admitting: Family Medicine

## 2021-01-19 DIAGNOSIS — E2839 Other primary ovarian failure: Secondary | ICD-10-CM

## 2021-01-19 DIAGNOSIS — I10 Essential (primary) hypertension: Secondary | ICD-10-CM

## 2021-03-13 ENCOUNTER — Other Ambulatory Visit: Payer: Self-pay | Admitting: Family Medicine

## 2021-03-13 DIAGNOSIS — E782 Mixed hyperlipidemia: Secondary | ICD-10-CM

## 2021-04-13 DIAGNOSIS — H0014 Chalazion left upper eyelid: Secondary | ICD-10-CM | POA: Diagnosis not present

## 2021-04-20 ENCOUNTER — Other Ambulatory Visit: Payer: Self-pay | Admitting: Family Medicine

## 2021-04-20 DIAGNOSIS — E2839 Other primary ovarian failure: Secondary | ICD-10-CM

## 2021-04-20 DIAGNOSIS — I10 Essential (primary) hypertension: Secondary | ICD-10-CM

## 2021-06-27 ENCOUNTER — Other Ambulatory Visit: Payer: Self-pay | Admitting: Family Medicine

## 2021-06-27 DIAGNOSIS — E782 Mixed hyperlipidemia: Secondary | ICD-10-CM

## 2021-07-16 ENCOUNTER — Other Ambulatory Visit: Payer: Self-pay | Admitting: Family Medicine

## 2021-07-16 DIAGNOSIS — I10 Essential (primary) hypertension: Secondary | ICD-10-CM

## 2021-07-16 DIAGNOSIS — E2839 Other primary ovarian failure: Secondary | ICD-10-CM

## 2021-09-03 ENCOUNTER — Ambulatory Visit (INDEPENDENT_AMBULATORY_CARE_PROVIDER_SITE_OTHER): Payer: Medicare Other | Admitting: Family Medicine

## 2021-09-03 ENCOUNTER — Encounter: Payer: Self-pay | Admitting: Family Medicine

## 2021-09-03 VITALS — BP 160/100 | HR 68 | Temp 97.8°F | Ht 64.0 in | Wt 155.1 lb

## 2021-09-03 DIAGNOSIS — E034 Atrophy of thyroid (acquired): Secondary | ICD-10-CM | POA: Diagnosis not present

## 2021-09-03 DIAGNOSIS — I10 Essential (primary) hypertension: Secondary | ICD-10-CM | POA: Diagnosis not present

## 2021-09-03 DIAGNOSIS — E782 Mixed hyperlipidemia: Secondary | ICD-10-CM

## 2021-09-03 DIAGNOSIS — F411 Generalized anxiety disorder: Secondary | ICD-10-CM

## 2021-09-03 DIAGNOSIS — Z9114 Patient's other noncompliance with medication regimen: Secondary | ICD-10-CM

## 2021-09-03 DIAGNOSIS — H9201 Otalgia, right ear: Secondary | ICD-10-CM

## 2021-09-03 LAB — LIPID PANEL
Cholesterol: 186 mg/dL (ref 0–200)
HDL: 36 mg/dL — ABNORMAL LOW (ref >39.00)
Total CHOL/HDL Ratio: 5
Triglycerides: 517 mg/dL — ABNORMAL HIGH (ref 0.0–149.0)

## 2021-09-03 LAB — COMPREHENSIVE METABOLIC PANEL
ALT: 31 U/L (ref 0–35)
AST: 33 U/L (ref 0–37)
Albumin: 4.6 g/dL (ref 3.5–5.2)
Alkaline Phosphatase: 44 U/L (ref 39–117)
BUN: 16 mg/dL (ref 6–23)
CO2: 31 mEq/L (ref 19–32)
Calcium: 9.9 mg/dL (ref 8.4–10.5)
Chloride: 97 mEq/L (ref 96–112)
Creatinine, Ser: 0.69 mg/dL (ref 0.40–1.20)
GFR: 90.4 mL/min (ref >60.00)
Glucose, Bld: 88 mg/dL (ref 70–99)
Potassium: 3.9 mEq/L (ref 3.5–5.1)
Sodium: 138 mEq/L (ref 135–145)
Total Bilirubin: 0.7 mg/dL (ref 0.2–1.2)
Total Protein: 8.2 g/dL (ref 6.0–8.3)

## 2021-09-03 LAB — CBC WITH DIFFERENTIAL/PLATELET
Basophils Absolute: 0.1 10*3/uL (ref 0.0–0.1)
Basophils Relative: 1 % (ref 0.0–3.0)
Eosinophils Absolute: 0.4 10*3/uL (ref 0.0–0.7)
Eosinophils Relative: 4.2 % (ref 0.0–5.0)
HCT: 45.6 % (ref 36.0–46.0)
Hemoglobin: 15.5 g/dL — ABNORMAL HIGH (ref 12.0–15.0)
Lymphocytes Relative: 41 % (ref 12.0–46.0)
Lymphs Abs: 4.3 10*3/uL — ABNORMAL HIGH (ref 0.7–4.0)
MCHC: 34 g/dL (ref 30.0–36.0)
MCV: 91.2 fl (ref 78.0–100.0)
Monocytes Absolute: 0.6 10*3/uL (ref 0.1–1.0)
Monocytes Relative: 6.2 % (ref 3.0–12.0)
Neutro Abs: 5 10*3/uL (ref 1.4–7.7)
Neutrophils Relative %: 47.6 % (ref 43.0–77.0)
Platelets: 201 10*3/uL (ref 150.0–400.0)
RBC: 5 Mil/uL (ref 3.87–5.11)
RDW: 13 % (ref 11.5–15.5)
WBC: 10.4 10*3/uL (ref 4.0–10.5)

## 2021-09-03 LAB — TSH: TSH: 22.54 u[IU]/mL — ABNORMAL HIGH (ref 0.35–5.50)

## 2021-09-03 LAB — LDL CHOLESTEROL, DIRECT: Direct LDL: 76 mg/dL

## 2021-09-03 MED ORDER — CHOLESTYRAMINE 4 G PO PACK: PACK | ORAL | 1 refills | Status: DC

## 2021-09-03 MED ORDER — AMOXICILLIN 500 MG PO CAPS: 1000.0000 mg | ORAL_CAPSULE | Freq: Two times a day (BID) | ORAL | 0 refills | Status: AC

## 2021-09-03 MED ORDER — LEVOTHYROXINE SODIUM 88 MCG PO TABS: 88.0000 ug | ORAL_TABLET | Freq: Every day | ORAL | 1 refills | Status: DC

## 2021-09-03 MED ORDER — LEVOTHYROXINE SODIUM 88 MCG PO TABS
88.0000 ug | ORAL_TABLET | Freq: Every day | ORAL | 1 refills | Status: DC
Start: 2021-09-03 — End: 2021-09-03

## 2021-09-03 MED ORDER — CHOLESTYRAMINE 4 G PO PACK
PACK | ORAL | 1 refills | Status: DC
Start: 2021-09-03 — End: 2021-10-05

## 2021-09-03 NOTE — Progress Notes (Signed)
Stacy Hart DOB: 1955-12-31 Encounter date: 09/03/2021  This is a 65 y.o. female who presents with Chief Complaint  Patient presents with   Medication Refill   Ear Pain    Patient complains of right ear pain x1 week    History of present illness: Last visit was 12/2019 for preventative care.   Right ear has been bothering her - started about a week and a half ago - but sinuses have been acting up too. Getting sharp pains in right ear. Not hearing as well on that side. Did take 1 amoxicillin (had some leftover) from dental procedure.   Had COVID in January.   Hypothyroid: has been good about compliance with medication. Didn't feel like there was change with name brand versus regular. Ran out of medication, but had old rx for . Taking 1/2 tab daily of this.   Seasonal allergies: taking half of children's benadryl for allergies which helps with sleep and allergies.   HL: needs cholestyramine increased.  HTN: didn't increase as directed last time with maxide. Doesn't take medication regularly, but has been last couple of weeks. Taking a half tablet twice daily  when she is taking it properly. Whole tablet made her not feel well.  Estrogen def: doesn't need refills now; feels better when she takes this.  Is intermittently  Does have baseline anxiety.   Energy level is better since taking thyroid medication regularly.  Allergies  Allergen Reactions   Epinephrine     "interferes with heart"   Sulfa Antibiotics    Ceftin [Cefuroxime Axetil] Rash   Current Meds  Medication Sig   ALPRAZolam (XANAX) 0.25 MG tablet Take 1 tablet (0.25 mg total) by mouth at bedtime as needed for anxiety.   cholestyramine (QUESTRAN) 4 g packet MIX 1 PACKET AND DRINK TWICE DAILY AS DIRECTED   estradiol (ESTRACE) 2 MG tablet TAKE 1 TABLET BY MOUTH EVERY DAY   loperamide (IMODIUM A-D) 2 MG tablet Take 2 mg by mouth daily.   omeprazole (PRILOSEC) 20 MG capsule Take 1 capsule (20 mg total) by  mouth daily.   SYNTHROID 88 MCG tablet Take 1 tablet (88 mcg total) by mouth daily before breakfast.   triamterene-hydrochlorothiazide (MAXZIDE-25) 37.5-25 MG tablet TAKE 1.5 TABLETS BY MOUTH EVERY DAY    Review of Systems  Constitutional:  Negative for chills, fatigue and fever.  Respiratory:  Negative for cough, chest tightness, shortness of breath and wheezing.   Cardiovascular:  Negative for chest pain, palpitations and leg swelling.  Neurological:  Positive for tremors (notes occasionally she feels that neck has a little tremor; nothing in hands; doesn't want investigated now, just wanted it noted.).   Objective:  BP (!) 160/100 (BP Location: Left Arm, Patient Position: Sitting, Cuff Size: Normal)   Pulse 68   Temp 97.8 F (36.6 C) (Oral)   Ht 5\' 4"  (1.626 m)   Wt 155 lb 1.6 oz (70.4 kg)   SpO2 98%   BMI 26.62 kg/m   Weight: 155 lb 1.6 oz (70.4 kg)   BP Readings from Last 3 Encounters:  09/03/21 (!) 160/100  12/31/19 (!) 170/100  08/23/19 (!) 146/92   Wt Readings from Last 3 Encounters:  09/03/21 155 lb 1.6 oz (70.4 kg)  12/31/19 152 lb 6.4 oz (69.1 kg)  08/23/19 152 lb 9.6 oz (69.2 kg)    Physical Exam Constitutional:      General: She is not in acute distress.    Appearance: She is well-developed.  HENT:  Right Ear: There is impacted cerumen.     Left Ear: Tympanic membrane, ear canal and external ear normal.     Ears:     Comments: There is large open comedone posterior right ear, see picture Cardiovascular:     Rate and Rhythm: Normal rate and regular rhythm.     Heart sounds: Normal heart sounds. No murmur heard.   No friction rub.  Pulmonary:     Effort: Pulmonary effort is normal. No respiratory distress.     Breath sounds: Normal breath sounds. No wheezing or rales.  Musculoskeletal:     Right lower leg: No edema.     Left lower leg: No edema.  Neurological:     Mental Status: She is alert and oriented to person, place, and time.     Cranial  Nerves: Cranial nerves 2-12 are intact.     Motor: No tremor or abnormal muscle tone.     Gait: Gait is intact.     Deep Tendon Reflexes:     Reflex Scores:      Bicep reflexes are 2+ on the right side and 2+ on the left side.      Brachioradialis reflexes are 2+ on the right side and 2+ on the left side.      Patellar reflexes are 2+ on the right side and 2+ on the left side. Psychiatric:        Behavior: Behavior normal.     Assessment/Plan  1. Essential hypertension Blood pressure is elevated today.  She is not compliant with medications and takes it regularly.  She does not like taking medication for blood pressure.  We discussed importance of taking it and that keeping blood pressure well controlled does prevent heart attack and strokes.  She has gotten herself to regularly take her thyroid medication, so she is going to work on being more regular about other medications. - CBC with Differential/Platelet; Future - Comprehensive metabolic panel; Future - Comprehensive metabolic panel - CBC with Differential/Platelet  2. Hypothyroidism due to acquired atrophy of thyroid Recheck lab work today.  She has been compliant with a half a dose of her Synthroid daily.  She states she is okay with trying to increase up to a full dose daily if needed pending blood work. - TSH; Future - levothyroxine (SYNTHROID) 88 MCG tablet; Take 1 tablet (88 mcg total) by mouth daily before breakfast.  Dispense: 90 tablet; Refill: 1 - TSH  3. Mixed hyperlipidemia - Lipid panel; Future - cholestyramine (QUESTRAN) 4 g packet; MIX 1 PACKET AND DRINK TWICE DAILY AS DIRECTED  Dispense: 180 each; Refill: 1 - Lipid panel  4. Generalized anxiety disorder Anxiety has been stable.  5. Noncompliance with medications History of taking blood pressure and thyroid medication intermittently.  Discussed importance of compliance of medication for blood pressure control.  6. Right ear pain Treating with amoxicillin  due to pain and history of recurrent sinus infections with communicating open cleft/ear infections.  She also has cerumen impaction on the right.  I do not feel comfortable flushing this or treating it today due to discomfort in the ear.  I advised over-the-counter earwax softening drops and we will cover for ear infection with antibiotic.  Return to office if any worsening or if discomfort is not resolved in the next couple of weeks.   Return in about 3 months (around 12/04/2021) for Chronic condition visit - blood pressure follow up.  40 minutes spent in chart review, charting, time with patient,  discussion of medications and compliance.   Theodis Shove, MD

## 2021-09-03 NOTE — Patient Instructions (Signed)
Use ear wax softening drops - sweet oil or debrox daily for next couple of weeks.

## 2021-10-05 MED ORDER — CHOLESTYRAMINE 4 G PO PACK: PACK | ORAL | 1 refills | Status: DC

## 2021-10-05 NOTE — Addendum Note (Signed)
Addended by: Johnella Moloney on: 10/05/2021 08:48 AM   Modules accepted: Orders

## 2021-12-05 ENCOUNTER — Ambulatory Visit (INDEPENDENT_AMBULATORY_CARE_PROVIDER_SITE_OTHER): Payer: Medicare Other | Admitting: Family Medicine

## 2021-12-05 ENCOUNTER — Encounter: Payer: Self-pay | Admitting: Family Medicine

## 2021-12-05 VITALS — BP 140/72 | HR 83 | Temp 97.9°F | Ht 64.0 in | Wt 155.2 lb

## 2021-12-05 DIAGNOSIS — E034 Atrophy of thyroid (acquired): Secondary | ICD-10-CM | POA: Diagnosis not present

## 2021-12-05 DIAGNOSIS — I10 Essential (primary) hypertension: Secondary | ICD-10-CM

## 2021-12-05 DIAGNOSIS — E782 Mixed hyperlipidemia: Secondary | ICD-10-CM | POA: Diagnosis not present

## 2021-12-05 LAB — COMPREHENSIVE METABOLIC PANEL
ALT: 28 U/L (ref 0–35)
AST: 29 U/L (ref 0–37)
Albumin: 4.5 g/dL (ref 3.5–5.2)
Alkaline Phosphatase: 41 U/L (ref 39–117)
BUN: 15 mg/dL (ref 6–23)
CO2: 27 mEq/L (ref 19–32)
Calcium: 9.8 mg/dL (ref 8.4–10.5)
Chloride: 99 mEq/L (ref 96–112)
Creatinine, Ser: 0.66 mg/dL (ref 0.40–1.20)
GFR: 91.21 mL/min (ref >60.00)
Glucose, Bld: 90 mg/dL (ref 70–99)
Potassium: 4.2 mEq/L (ref 3.5–5.1)
Sodium: 139 mEq/L (ref 135–145)
Total Bilirubin: 0.5 mg/dL (ref 0.2–1.2)
Total Protein: 8.8 g/dL — ABNORMAL HIGH (ref 6.0–8.3)

## 2021-12-05 LAB — LIPID PANEL
Cholesterol: 165 mg/dL (ref 0–200)
HDL: 36.9 mg/dL — ABNORMAL LOW (ref >39.00)
Total CHOL/HDL Ratio: 4
Triglycerides: 438 mg/dL — ABNORMAL HIGH (ref 0.0–149.0)

## 2021-12-05 LAB — CBC WITH DIFFERENTIAL/PLATELET
Basophils Absolute: 0.1 10*3/uL (ref 0.0–0.1)
Basophils Relative: 1 % (ref 0.0–3.0)
Eosinophils Absolute: 0.5 10*3/uL (ref 0.0–0.7)
Eosinophils Relative: 4.6 % (ref 0.0–5.0)
HCT: 43.9 % (ref 36.0–46.0)
Hemoglobin: 14.8 g/dL (ref 12.0–15.0)
Lymphocytes Relative: 41.2 % (ref 12.0–46.0)
Lymphs Abs: 4.6 10*3/uL — ABNORMAL HIGH (ref 0.7–4.0)
MCHC: 33.7 g/dL (ref 30.0–36.0)
MCV: 90.5 fl (ref 78.0–100.0)
Monocytes Absolute: 0.7 10*3/uL (ref 0.1–1.0)
Monocytes Relative: 5.9 % (ref 3.0–12.0)
Neutro Abs: 5.3 10*3/uL (ref 1.4–7.7)
Neutrophils Relative %: 47.3 % (ref 43.0–77.0)
Platelets: 203 10*3/uL (ref 150.0–400.0)
RBC: 4.85 Mil/uL (ref 3.87–5.11)
RDW: 12.7 % (ref 11.5–15.5)
WBC: 11.1 10*3/uL — ABNORMAL HIGH (ref 4.0–10.5)

## 2021-12-05 LAB — TSH: TSH: 14.24 u[IU]/mL — ABNORMAL HIGH (ref 0.35–5.50)

## 2021-12-05 LAB — LDL CHOLESTEROL, DIRECT: Direct LDL: 63 mg/dL

## 2021-12-05 MED ORDER — SYNTHROID 88 MCG PO TABS: 88.0000 ug | ORAL_TABLET | Freq: Every day | ORAL | 1 refills | Status: AC

## 2021-12-05 NOTE — Progress Notes (Signed)
Stacy Hart DOB: 24-Feb-1956 Encounter date: 12/05/2021  This is a 66 y.o. female who presents with Chief Complaint  Patient presents with   Follow-up    History of present illness:  At last visit we discussed compliance with blood pressure medications.  She had not increased Maxide as previously been directed.  She was not taking her medication regularly.  Was only able to tolerate half a tablet rather than whole.  She committed to being more compliant with blood pressure medications and is here for follow-up to recheck pressures.   Her pressures at home have been better. If she takes whole pill then her heart feels a little fluttering. Takes 1/2 pill in morning and then second half of pill in afternoon. Numbers 127-138/  She is taking whole thyroid pill daily. Has felt a little different on the generic form that she has. Gets some stomach cramping (more than brand name)[id number]. Just felt better on the name brand. In the past she has had a hard time with compliance with this, but states she has been taking daily.    Allergies  Allergen Reactions   Epinephrine     "interferes with heart"   Sulfa Antibiotics    Ceftin [Cefuroxime Axetil] Rash   Current Meds  Medication Sig   ALPRAZolam (XANAX) 0.25 MG tablet Take 1 tablet (0.25 mg total) by mouth at bedtime as needed for anxiety.   cholestyramine (QUESTRAN) 4 g packet MIX 1 PACKET AND DRINK TWICE DAILY AS DIRECTED   estradiol (ESTRACE) 2 MG tablet TAKE 1 TABLET BY MOUTH EVERY DAY   loperamide (IMODIUM A-D) 2 MG tablet Take 2 mg by mouth daily.   omeprazole (PRILOSEC) 20 MG capsule Take 1 capsule (20 mg total) by mouth daily.   SYNTHROID 88 MCG tablet Take 1 tablet (88 mcg total) by mouth daily before breakfast.   triamterene-hydrochlorothiazide (MAXZIDE-25) 37.5-25 MG tablet TAKE 1.5 TABLETS BY MOUTH EVERY DAY   [DISCONTINUED] levothyroxine (SYNTHROID) 88 MCG tablet Take 1 tablet (88 mcg total) by mouth daily before breakfast.     Review of Systems  Constitutional:  Negative for chills, fatigue and fever.  Respiratory:  Negative for cough, chest tightness, shortness of breath and wheezing.   Cardiovascular:  Negative for chest pain, palpitations and leg swelling.   Objective:  BP 140/72 (BP Location: Left Arm, Patient Position: Sitting, Cuff Size: Large)    Pulse 83    Temp 97.9 F (36.6 C) (Oral)    Ht 5\' 4"  (1.626 m)    Wt 155 lb 3.2 oz (70.4 kg)    SpO2 99%    BMI 26.64 kg/m   Weight: 155 lb 3.2 oz (70.4 kg)   BP Readings from Last 3 Encounters:  12/05/21 140/72  09/03/21 (!) 160/100  12/31/19 (!) 170/100   Wt Readings from Last 3 Encounters:  12/05/21 155 lb 3.2 oz (70.4 kg)  09/03/21 155 lb 1.6 oz (70.4 kg)  12/31/19 152 lb 6.4 oz (69.1 kg)    Physical Exam Constitutional:      General: She is not in acute distress.    Appearance: She is well-developed.  Cardiovascular:     Rate and Rhythm: Normal rate and regular rhythm.     Heart sounds: Normal heart sounds. No murmur heard.   No friction rub.  Pulmonary:     Effort: Pulmonary effort is normal. No respiratory distress.     Breath sounds: Normal breath sounds. No wheezing or rales.  Musculoskeletal:  Cervical back: Neck supple.     Right lower leg: No edema.     Left lower leg: No edema.  Neurological:     Mental Status: She is alert and oriented to person, place, and time.  Psychiatric:        Behavior: Behavior normal.    Assessment/Plan  1. Essential hypertension Blood pressure looking much better with current medication.  - CBC with Differential/Platelet; Future - Comprehensive metabolic panel; Future  2. Hypothyroidism due to acquired atrophy of thyroid She prefers and felt better on name brand. Rx printed today.  - TSH; Future - SYNTHROID 88 MCG tablet; Take 1 tablet (88 mcg total) by mouth daily before breakfast.  Dispense: 90 tablet; Refill: 1  3. Mixed hyperlipidemia She has declined change in therapy and  continues on cholestyramine s/p cholecystectomy. - Lipid panel; Future   Return in about 6 months (around 06/04/2022) for Chronic condition visit.     Theodis Shove, MD

## 2021-12-06 ENCOUNTER — Other Ambulatory Visit: Payer: Self-pay | Admitting: Family Medicine

## 2021-12-07 NOTE — Progress Notes (Signed)
Noted  

## 2021-12-24 ENCOUNTER — Other Ambulatory Visit: Payer: Self-pay | Admitting: Family Medicine

## 2021-12-24 DIAGNOSIS — G4700 Insomnia, unspecified: Secondary | ICD-10-CM

## 2021-12-24 DIAGNOSIS — F419 Anxiety disorder, unspecified: Secondary | ICD-10-CM

## 2022-01-08 ENCOUNTER — Ambulatory Visit (INDEPENDENT_AMBULATORY_CARE_PROVIDER_SITE_OTHER): Payer: Medicare Other

## 2022-01-08 VITALS — Ht 64.0 in | Wt 155.0 lb

## 2022-01-08 DIAGNOSIS — Z0000 Encounter for general adult medical examination without abnormal findings: Secondary | ICD-10-CM | POA: Diagnosis not present

## 2022-01-08 NOTE — Patient Instructions (Addendum)
?Ms. Doenges , ?Thank you for taking time to come for your Medicare Wellness Visit. I appreciate your ongoing commitment to your health goals. Please review the following plan we discussed and let me know if I can assist you in the future.  ? ?These are the goals we discussed: ? Goals   ? ?   Take time to care for self (pt-stated)   ?   Focus on self care. ?  ? ?  ?  ?This is a list of the screening recommended for you and due dates:  ?Health Maintenance  ?Topic Date Due  ? Flu Shot  01/18/2022*  ? COVID-19 Vaccine (1) 01/24/2022*  ? Zoster (Shingles) Vaccine (1 of 2) 03/04/2022*  ? Mammogram  12/05/2022*  ? Tetanus Vaccine  12/05/2022*  ? Pneumonia Vaccine (1 - PCV) 01/09/2023*  ? Colon Cancer Screening  01/09/2023*  ? DEXA scan (bone density measurement)  Completed  ? Hepatitis C Screening: USPSTF Recommendation to screen - Ages 75-79 yo.  Completed  ? HPV Vaccine  Aged Out  ?*Topic was postponed. The date shown is not the original due date.  ? ?Advanced directives: No Patient deferred ? ?Conditions/risks identified: None ? ?Next appointment: Follow up in one year for your annual wellness visit  ? ? ?Preventive Care 22 Years and Older, Female ?Preventive care refers to lifestyle choices and visits with your health care provider that can promote health and wellness. ?What does preventive care include? ?A yearly physical exam. This is also called an annual well check. ?Dental exams once or twice a year. ?Routine eye exams. Ask your health care provider how often you should have your eyes checked. ?Personal lifestyle choices, including: ?Daily care of your teeth and gums. ?Regular physical activity. ?Eating a healthy diet. ?Avoiding tobacco and drug use. ?Limiting alcohol use. ?Practicing safe sex. ?Taking low-dose aspirin every day. ?Taking vitamin and mineral supplements as recommended by your health care provider. ?What happens during an annual well check? ?The services and screenings done by your health care  provider during your annual well check will depend on your age, overall health, lifestyle risk factors, and family history of disease. ?Counseling  ?Your health care provider may ask you questions about your: ?Alcohol use. ?Tobacco use. ?Drug use. ?Emotional well-being. ?Home and relationship well-being. ?Sexual activity. ?Eating habits. ?History of falls. ?Memory and ability to understand (cognition). ?Work and work Statistician. ?Reproductive health. ?Screening  ?You may have the following tests or measurements: ?Height, weight, and BMI. ?Blood pressure. ?Lipid and cholesterol levels. These may be checked every 5 years, or more frequently if you are over 85 years old. ?Skin check. ?Lung cancer screening. You may have this screening every year starting at age 5 if you have a 30-pack-year history of smoking and currently smoke or have quit within the past 15 years. ?Fecal occult blood test (FOBT) of the stool. You may have this test every year starting at age 88. ?Flexible sigmoidoscopy or colonoscopy. You may have a sigmoidoscopy every 5 years or a colonoscopy every 10 years starting at age 90. ?Hepatitis C blood test. ?Hepatitis B blood test. ?Sexually transmitted disease (STD) testing. ?Diabetes screening. This is done by checking your blood sugar (glucose) after you have not eaten for a while (fasting). You may have this done every 1-3 years. ?Bone density scan. This is done to screen for osteoporosis. You may have this done starting at age 54. ?Mammogram. This may be done every 1-2 years. Talk to your health care provider  about how often you should have regular mammograms. ?Talk with your health care provider about your test results, treatment options, and if necessary, the need for more tests. ?Vaccines  ?Your health care provider may recommend certain vaccines, such as: ?Influenza vaccine. This is recommended every year. ?Tetanus, diphtheria, and acellular pertussis (Tdap, Td) vaccine. You may need a Td  booster every 10 years. ?Zoster vaccine. You may need this after age 75. ?Pneumococcal 13-valent conjugate (PCV13) vaccine. One dose is recommended after age 60. ?Pneumococcal polysaccharide (PPSV23) vaccine. One dose is recommended after age 33. ?Talk to your health care provider about which screenings and vaccines you need and how often you need them. ?This information is not intended to replace advice given to you by your health care provider. Make sure you discuss any questions you have with your health care provider. ?Document Released: 11/03/2015 Document Revised: 06/26/2016 Document Reviewed: 08/08/2015 ?Elsevier Interactive Patient Education ? 2017 [employer]. ? ?Fall Prevention in the Home ?Falls can cause injuries. They can happen to people of all ages. There are many things you can do to make your home safe and to help prevent falls. ?What can I do on the outside of my home? ?Regularly fix the edges of walkways and driveways and fix any cracks. ?Remove anything that might make you trip as you walk through a door, such as a raised step or threshold. ?Trim any bushes or trees on the path to your home. ?Use bright outdoor lighting. ?Clear any walking paths of anything that might make someone trip, such as rocks or tools. ?Regularly check to see if handrails are loose or broken. Make sure that both sides of any steps have handrails. ?Any raised decks and porches should have guardrails on the edges. ?Have any leaves, snow, or ice cleared regularly. ?Use sand or salt on walking paths during winter. ?Clean up any spills in your garage right away. This includes oil or grease spills. ?What can I do in the bathroom? ?Use night lights. ?Install grab bars by the toilet and in the tub and shower. Do not use towel bars as grab bars. ?Use non-skid mats or decals in the tub or shower. ?If you need to sit down in the shower, use a plastic, non-slip stool. ?Keep the floor dry. Clean up any water that spills on the floor  as soon as it happens. ?Remove soap buildup in the tub or shower regularly. ?Attach bath mats securely with double-sided non-slip rug tape. ?Do not have throw rugs and other things on the floor that can make you trip. ?What can I do in the bedroom? ?Use night lights. ?Make sure that you have a light by your bed that is easy to reach. ?Do not use any sheets or blankets that are too big for your bed. They should not hang down onto the floor. ?Have a firm chair that has side arms. You can use this for support while you get dressed. ?Do not have throw rugs and other things on the floor that can make you trip. ?What can I do in the kitchen? ?Clean up any spills right away. ?Avoid walking on wet floors. ?Keep items that you use a lot in easy-to-reach places. ?If you need to reach something above you, use a strong step stool that has a grab bar. ?Keep electrical cords out of the way. ?Do not use floor polish or wax that makes floors slippery. If you must use wax, use non-skid floor wax. ?Do not have throw rugs and  other things on the floor that can make you trip. ?What can I do with my stairs? ?Do not leave any items on the stairs. ?Make sure that there are handrails on both sides of the stairs and use them. Fix handrails that are broken or loose. Make sure that handrails are as long as the stairways. ?Check any carpeting to make sure that it is firmly attached to the stairs. Fix any carpet that is loose or worn. ?Avoid having throw rugs at the top or bottom of the stairs. If you do have throw rugs, attach them to the floor with carpet tape. ?Make sure that you have a light switch at the top of the stairs and the bottom of the stairs. If you do not have them, ask someone to add them for you. ?What else can I do to help prevent falls? ?Wear shoes that: ?Do not have high heels. ?Have rubber bottoms. ?Are comfortable and fit you well. ?Are closed at the toe. Do not wear sandals. ?If you use a stepladder: ?Make sure that it is  fully opened. Do not climb a closed stepladder. ?Make sure that both sides of the stepladder are locked into place. ?Ask someone to hold it for you, if possible. ?Clearly mark and make sure that you can see:

## 2022-01-08 NOTE — Progress Notes (Signed)
? ?Subjective:  ? Stacy Hart is a 66 y.o. female who presents for Medicare Annual (Subsequent) preventive examination. ? ?Review of Systems    ?Virtual Visit via Telephone Note ? ?I connected with  Stacy Hart on 01/08/22 at 10:30 AM EDT by telephone and verified that I am speaking with the correct person using two identifiers. ? ?Location: ?Patient: Home ?Provider: Office ?Persons participating in the virtual visit: patient/Nurse Health Advisor ?  ?I discussed the limitations, risks, security and privacy concerns of performing an evaluation and management service by telephone and the availability of in person appointments. The patient expressed understanding and agreed to proceed. ? ?Interactive audio and video telecommunications were attempted between this nurse and patient, however failed, due to patient having technical difficulties OR patient did not have access to video capability.  We continued and completed visit with audio only. ? ?Some vital signs may be absent or patient reported.  ? ?Stacy Rung, LPN  ?Cardiac Risk Factors include: advanced age (>69men, >42 women);hypertension ? ?   ?Objective:  ?  ?Today's Vitals  ? 01/08/22 1034  ?Weight: 155 lb (70.3 kg)  ?Height: 5\' 4"  (1.626 m)  ? ?Body mass index is 26.61 kg/m?. ? ?Advanced Directives 01/08/2022  ?Does Patient Have a Medical Advance Directive? No  ?Would patient like information on creating a medical advance directive? No - Patient declined  ? ? ?Current Medications (verified) ?Outpatient Encounter Medications as of 01/08/2022  ?Medication Sig  ? ALPRAZolam (XANAX) 0.25 MG tablet TAKE 1 TABLET (0.25 MG TOTAL) BY MOUTH AT BEDTIME AS NEEDED FOR ANXIETY.  ? cholestyramine (QUESTRAN) 4 g packet MIX 1 PACKET AND DRINK TWICE DAILY AS DIRECTED  ? estradiol (ESTRACE) 2 MG tablet TAKE 1 TABLET BY MOUTH EVERY DAY  ? loperamide (IMODIUM A-D) 2 MG tablet Take 2 mg by mouth daily.  ? omeprazole (PRILOSEC) 20 MG capsule Take 1 capsule (20  mg total) by mouth daily.  ? SYNTHROID 88 MCG tablet Take 1 tablet (88 mcg total) by mouth daily before breakfast.  ? triamterene-hydrochlorothiazide (MAXZIDE-25) 37.5-25 MG tablet TAKE 1.5 TABLETS BY MOUTH EVERY DAY  ? ?No facility-administered encounter medications on file as of 01/08/2022.  ? ? ?Allergies (verified) ?Epinephrine, Sulfa antibiotics, and Ceftin [cefuroxime axetil]  ? ?History: ?Past Medical History:  ?Diagnosis Date  ? Acquired keratoderma   ? Allergy   ? Anxiety   ? Arthritis   ? Benign paroxysmal positional vertigo   ? Chicken pox   ? childhood  ? Cleft palate   ? Esophageal reflux   ? Gallstones   ? GERD (gastroesophageal reflux disease)   ? Gout, unspecified   ? Measles   ? childhood  ? Mixed hyperlipidemia   ? Other specified conditions influencing health status(V49.89)   ? Pain in limb   ? Personal history of other diseases of circulatory system   ? SVT (supraventricular tachycardia) (HCC)   ? Symptomatic menopausal or female climacteric states   ? Unspecified asthma(493.90)   ? Unspecified essential hypertension   ? Unspecified hypothyroidism   ? Unspecified vitamin D deficiency   ? Vitreous detachment of right eye 03/21/2014  ? 05/21/2014  ? ?Past Surgical History:  ?Procedure Laterality Date  ? ABDOMINAL HYSTERECTOMY  2001  ? Ovaries intact, DUB, uterine fibroids  ? COSMETIC SURGERY    ? FOOT SURGERY  2009  ? Left   Morton's Neuroma  ? GALLBLADDER SURGERY  1998  ? Heart ablation  1996  ? SVT  ?  OOPHORECTOMY  2014  ? surgeries for cleft palatte and lips    ? [facility]  ? TUBAL LIGATION    ? ?Family History  ?Problem Relation Age of Onset  ? Heart disease Mother   ? Stroke Mother   ? Depression Mother   ? Diabetes Mother   ? Cancer Mother [id number]  ?     tumor behind eye  ? Heart disease Father   ? Hypothyroidism Father   ? Other Father   ?     dvt; on coumadin  ? Diabetes Sister   ? Heart disease Sister   ? Hypertension Sister   ? Depression Sister   ? Hyperlipidemia Sister   ? Anxiety disorder Sister   ?  Colon cancer Sister [id number]  ?     mets, may have been liver  ? Diabetes Brother   ? Pulmonary embolism Brother   ? Deep vein thrombosis Brother   ? Stroke Brother   ?     CVA  ? Colon cancer Brother   ? Kidney failure Brother   ? Celiac disease Daughter   ? ?Social History  ? ?Socioeconomic History  ? Marital status: Married  ?  Spouse name: Not on file  ? Number of children: 2  ? Years of education: 6712  ? Highest education level: Not on file  ?Occupational History  ? Occupation: Full time Parent  ?Tobacco Use  ? Smoking status: Former  ?  Packs/day: 2.00  ?  Years: 7.00  ?  Pack years: 14.00  ?  Types: Cigarettes  ?  Quit date: 07/23/1983  ?  Years since quitting: 38.4  ? Smokeless tobacco: Never  ?Substance and Sexual Activity  ? Alcohol use: Yes  ?  Comment: occasonal once a week  ? Drug use: No  ? Sexual activity: Yes  ?Other Topics Concern  ? Not on file  ?Social History Narrative  ? Marital status:  Married x 30 years,happily, no domestic abuse.   ?    Children: 2 daughters; no grandchildren.  ?    Lives: with husband, adult daughter.  ?    Employment: homemaker.  ?    Tobacco:  never  ?    Alcohol: social  ?    Drugs:none  ?     Guns:  No guns in the home. Smoke alarm in the home. Caffeine use: None. Well balanced diet.     Exercise history: Light, walking , 20 min., 2 - 3 times weekly. Organ donor: NO. Pt DOES have Living Will. DOES have HCPOA.  ? ?Social Determinants of Health  ? ?Financial Resource Strain: Low Risk   ? Difficulty of Paying Living Expenses: Not hard at all  ?Food Insecurity: No Food Insecurity  ? Worried About Programme researcher, broadcasting/film/videounning Out of Food in the Last Year: Never true  ? Ran Out of Food in the Last Year: Never true  ?Transportation Needs: No Transportation Needs  ? Lack of Transportation (Medical): No  ? Lack of Transportation (Non-Medical): No  ?Physical Activity: Inactive  ? Days of Exercise per Week: 0 days  ? Minutes of Exercise per Session: 0 min  ?Stress: No Stress Concern Present  ? Feeling of  Stress : Not at all  ?Social Connections: Moderately Isolated  ? Frequency of Communication with Friends and Family: More than three times a week  ? Frequency of Social Gatherings with Friends and Family: More than three times a week  ? Attends Religious Services: Never  ? Active  Member of Clubs or Organizations: No  ? Attends Banker Meetings: Never  ? Marital Status: Married  ? ? ? ?Clinical Intake: ? ?Pre-visit preparation completed: NoHow often do you need to have someone help you when you read instructions, pamphlets, or other written materials from your doctor or pharmacy?: 1 - Never ? ?Diabetic?  No ? ? ?Activities of Daily Living ?In your present state of health, do you have any difficulty performing the following activities: 01/08/2022  ?Hearing? N  ?Vision? N  ?Difficulty concentrating or making decisions? N  ?Walking or climbing stairs? N  ?Dressing or bathing? N  ?Doing errands, shopping? N  ?Preparing Food and eating ? N  ?Using the Toilet? N  ?In the past six months, have you accidently leaked urine? N  ?Do you have problems with loss of bowel control? N  ?Managing your Medications? N  ?Managing your Finances? N  ?Housekeeping or managing your Housekeeping? N  ?Some recent data might be hidden  ? ? ?Patient Care Team: ?Wynn Banker, MD as PCP - General (Family Medicine) ?Freddy Finner, MD as Consulting Physician (Obstetrics and Gynecology) ? ?Indicate any recent Medical Services you may have received from other than [facility] providers in the past year (date may be approximate). ? ?   ?Assessment:  ? This is a routine wellness examination for Ikeya. ? ?Hearing/Vision screen ?Hearing Screening - Comments:: No difficulty hearing ?Vision Screening - Comments:: Wears glasses. Followed by [facility] ? ?Dietary issues and exercise activities discussed: ?Exercise limited by: None identified ? ? Goals Addressed   ? ?  ?  ?  ?  ?  ? This Visit's Progress  ?   Take time to care for self  (pt-stated)     ?   Focus on self care. ?  ? ?  ? ?Depression Screen ?PHQ 2/9 Scores 01/08/2022 12/05/2021 08/23/2019 04/29/2018 03/11/2017 09/03/2016 05/31/2016  ?PHQ - 2 Score 0 0 0 0 0 0 0  ?PHQ- 9 Score 0 1 - - - -

## 2022-01-14 ENCOUNTER — Other Ambulatory Visit: Payer: Self-pay | Admitting: Family Medicine

## 2022-01-14 DIAGNOSIS — E2839 Other primary ovarian failure: Secondary | ICD-10-CM

## 2022-02-27 ENCOUNTER — Other Ambulatory Visit: Payer: Self-pay | Admitting: Family Medicine

## 2022-02-27 DIAGNOSIS — Z1231 Encounter for screening mammogram for malignant neoplasm of breast: Secondary | ICD-10-CM

## 2022-03-07 ENCOUNTER — Ambulatory Visit
Admission: RE | Admit: 2022-03-07 | Discharge: 2022-03-07 | Disposition: A | Discharge status: Home / Self Care | Payer: Medicare Other | Source: Ambulatory Visit | Attending: Family Medicine | Admitting: Family Medicine

## 2022-03-07 DIAGNOSIS — Z1231 Encounter for screening mammogram for malignant neoplasm of breast: Secondary | ICD-10-CM

## 2022-03-10 ENCOUNTER — Other Ambulatory Visit: Payer: Self-pay | Admitting: Family Medicine

## 2022-03-10 DIAGNOSIS — E2839 Other primary ovarian failure: Secondary | ICD-10-CM

## 2022-03-10 DIAGNOSIS — I10 Essential (primary) hypertension: Secondary | ICD-10-CM

## 2022-06-07 ENCOUNTER — Other Ambulatory Visit: Payer: Self-pay | Admitting: *Deleted

## 2022-06-07 DIAGNOSIS — I10 Essential (primary) hypertension: Secondary | ICD-10-CM

## 2022-06-07 MED ORDER — TRIAMTERENE-HCTZ 37.5-25 MG PO TABS: 1.5000 | ORAL_TABLET | Freq: Every day | ORAL | 0 refills | Status: DC

## 2022-06-07 NOTE — Telephone Encounter (Signed)
I will fill but she needs to see me by November for follow up

## 2022-09-06 ENCOUNTER — Other Ambulatory Visit: Payer: Self-pay | Admitting: Family Medicine

## 2022-09-06 DIAGNOSIS — I10 Essential (primary) hypertension: Secondary | ICD-10-CM

## 2022-09-29 ENCOUNTER — Other Ambulatory Visit: Payer: Self-pay | Admitting: Family Medicine

## 2022-09-29 DIAGNOSIS — I10 Essential (primary) hypertension: Secondary | ICD-10-CM

## 2022-11-06 ENCOUNTER — Telehealth: Payer: Self-pay | Admitting: Family Medicine

## 2022-11-06 DIAGNOSIS — E2839 Other primary ovarian failure: Secondary | ICD-10-CM

## 2022-11-06 DIAGNOSIS — E782 Mixed hyperlipidemia: Secondary | ICD-10-CM

## 2022-11-06 NOTE — Telephone Encounter (Signed)
Prescription Request Pt has relocated to [facility], [place] 11/06/2022  Is this a "Controlled Substance" medicine? No  LOV: Visit date not found  What is the name of the medication or equipment? cholestyramine (QUESTRAN) 4 g packet    estradiol (ESTRACE) 2 MG tablet  Have you contacted your pharmacy to request a refill? No   Which pharmacy would you like this sent to? estradiol (ESTRACE) 2 MG tablet-[facility]  cholestyramine (QUESTRAN) 4 g packet-[facility]  Patient notified that their request is being sent to the clinical staff for review and that they should receive a response within 2 business days.   Please advise at Mobile [phone] (mobile)

## 2022-11-07 MED ORDER — CHOLESTYRAMINE 4 G PO PACK: PACK | ORAL | 0 refills | Status: AC

## 2022-11-07 MED ORDER — ESTRADIOL 2 MG PO TABS: 2.0000 mg | ORAL_TABLET | Freq: Every day | ORAL | 0 refills | Status: AC

## 2022-11-07 NOTE — Addendum Note (Signed)
Addended by: Nilda Riggs on: 11/07/2022 01:31 PM   Modules accepted: Orders

## 2022-11-07 NOTE — Telephone Encounter (Signed)
Pt will see NP in her area on 04-01-2023 that was first appt they had available

## 2022-11-07 NOTE — Telephone Encounter (Signed)
Left a message for the patient to return my call.  °

## 2022-11-07 NOTE — Telephone Encounter (Signed)
Rx sent 

## 2022-11-07 NOTE — Telephone Encounter (Signed)
Patient has not seen Dr. Ethlyn Gallery since February 2023 and she has not established with me, Please ask the patient if she is planning on finding a different PCP

## 2022-11-07 NOTE — Telephone Encounter (Signed)
Ok well then it's ok to refill her medications until she can get in with her new NP

## 2022-12-16 DIAGNOSIS — H524 Presbyopia: Secondary | ICD-10-CM | POA: Diagnosis not present

## 2023-04-01 DIAGNOSIS — I10 Essential (primary) hypertension: Secondary | ICD-10-CM | POA: Diagnosis not present

## 2023-04-01 DIAGNOSIS — N6310 Unspecified lump in the right breast, unspecified quadrant: Secondary | ICD-10-CM | POA: Diagnosis not present

## 2023-04-01 DIAGNOSIS — Z1231 Encounter for screening mammogram for malignant neoplasm of breast: Secondary | ICD-10-CM | POA: Diagnosis not present

## 2023-04-01 DIAGNOSIS — N6452 Nipple discharge: Secondary | ICD-10-CM | POA: Diagnosis not present

## 2023-04-01 DIAGNOSIS — E039 Hypothyroidism, unspecified: Secondary | ICD-10-CM | POA: Diagnosis not present

## 2023-04-09 DIAGNOSIS — R92323 Mammographic fibroglandular density, bilateral breasts: Secondary | ICD-10-CM | POA: Diagnosis not present

## 2023-04-09 DIAGNOSIS — N6452 Nipple discharge: Secondary | ICD-10-CM | POA: Diagnosis not present

## 2023-04-09 DIAGNOSIS — R921 Mammographic calcification found on diagnostic imaging of breast: Secondary | ICD-10-CM | POA: Diagnosis not present

## 2023-05-16 DIAGNOSIS — I361 Nonrheumatic tricuspid (valve) insufficiency: Secondary | ICD-10-CM | POA: Diagnosis not present

## 2023-05-16 DIAGNOSIS — I340 Nonrheumatic mitral (valve) insufficiency: Secondary | ICD-10-CM | POA: Diagnosis not present

## 2023-10-10 DIAGNOSIS — R92321 Mammographic fibroglandular density, right breast: Secondary | ICD-10-CM | POA: Diagnosis not present

## 2023-10-10 DIAGNOSIS — N6315 Unspecified lump in the right breast, overlapping quadrants: Secondary | ICD-10-CM | POA: Diagnosis not present

## 2023-10-21 DIAGNOSIS — N6315 Unspecified lump in the right breast, overlapping quadrants: Secondary | ICD-10-CM | POA: Diagnosis not present
